# Patient Record
Sex: Male | Born: 2005 | Race: White | Hispanic: No | Marital: Single
Health system: Southern US, Academic
[De-identification: ages and names within clinical notes are randomized; demographics above are authoritative.]

## PROBLEM LIST (undated history)

## (undated) ENCOUNTER — Ambulatory Visit (HOSPITAL_COMMUNITY): Source: Home / Self Care

## (undated) ENCOUNTER — Encounter

## (undated) ENCOUNTER — Telehealth

## (undated) ENCOUNTER — Ambulatory Visit

## (undated) ENCOUNTER — Telehealth: Attending: Registered" | Primary: Registered"

## (undated) ENCOUNTER — Ambulatory Visit: Attending: Federally Qualified Health Center (FQHC) | Primary: Federally Qualified Health Center (FQHC)

## (undated) ENCOUNTER — Telehealth: Attending: Pediatric Gastroenterology | Primary: Pediatric Gastroenterology

## (undated) ENCOUNTER — Encounter: Attending: Pediatric Gastroenterology | Primary: Pediatric Gastroenterology

## (undated) ENCOUNTER — Encounter
Attending: Student in an Organized Health Care Education/Training Program | Primary: Student in an Organized Health Care Education/Training Program

## (undated) ENCOUNTER — Ambulatory Visit: Payer: BLUE CROSS/BLUE SHIELD | Attending: Registered" | Primary: Registered"

## (undated) ENCOUNTER — Ambulatory Visit: Payer: BLUE CROSS/BLUE SHIELD | Attending: Pediatric Gastroenterology | Primary: Pediatric Gastroenterology

## (undated) ENCOUNTER — Ambulatory Visit: Attending: Family | Primary: Family

## (undated) ENCOUNTER — Ambulatory Visit: Payer: BLUE CROSS/BLUE SHIELD

## (undated) DIAGNOSIS — I10 Essential (primary) hypertension: Secondary | ICD-10-CM

## (undated) DIAGNOSIS — A749 Chlamydial infection, unspecified: Secondary | ICD-10-CM

## (undated) DIAGNOSIS — J45909 Unspecified asthma, uncomplicated: Secondary | ICD-10-CM

## (undated) DIAGNOSIS — F419 Anxiety disorder, unspecified: Secondary | ICD-10-CM

## (undated) HISTORY — PX: MYRINGOTOMY WITH TUBE PLACEMENT: SHX5663

---
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
## 2023-01-10 DIAGNOSIS — K200 Eosinophilic esophagitis: Principal | ICD-10-CM

## 2023-01-10 MED ORDER — DUPIXENT 300 MG/2 ML SUBCUTANEOUS PEN INJECTOR
SUBCUTANEOUS | 11 refills | 28 days
Start: 2023-01-10 — End: 2024-02-09

## 2023-01-11 MED ORDER — DUPIXENT 300 MG/2 ML SUBCUTANEOUS PEN INJECTOR
SUBCUTANEOUS | 2 refills | 28 days | Status: CP
Start: 2023-01-11 — End: 2023-04-11
  Filled 2023-01-16: qty 8, 28d supply, fill #0

## 2023-01-12 NOTE — Unmapped (Signed)
[facility] Refill Coordination Note    Specialty Medication(s) to be Shipped:   Inflammatory Disorders: Dupixent    Other medication(s) to be shipped: No additional medications requested for fill at this time     Robert Allison, DOB: 2005-06-30  Phone: [phone] (home)       All above HIPAA information was verified with patient's family member, father.  *Informed Father that this was last refill and Patient would need an appointment before additional fills would be submitted per Provider's office.    Was a Nurse, learning disability used for this call? No    Completed refill call assessment today to schedule patient's medication shipment from the [facility] [phone]).  All relevant notes have been reviewed.     Specialty medication(s) and dose(s) confirmed: Regimen is correct and unchanged.   Changes to medications: [name] reports no changes at this time.  Changes to insurance: No  New side effects reported not previously addressed with a pharmacist or physician: None reported  Questions for the pharmacist: No    Confirmed patient received a Conservation officer, historic buildings and a Surveyor, mining with first shipment. The patient will receive a drug information handout for each medication shipped and additional FDA Medication Guides as required.       DISEASE/MEDICATION-SPECIFIC INFORMATION        For patients on injectable medications: Patient currently has 1 doses left.  Next injection is scheduled for 01/17/23.    SPECIALTY MEDICATION ADHERENCE     Medication Adherence    Patient reported X missed doses in the last month: 0  Specialty Medication: dupilumab (DUPIXENT PEN) 300 mg/2 mL PnIj  Patient is on additional specialty medications: No  Informant: father              Were doses missed due to medication being on hold? No    DUPIXENT PEN 300 mg/2 mL Pnij (dupilumab)  : 5 days of medicine on hand       REFERRAL TO PHARMACIST     Referral to the pharmacist: Not needed      [facility]     Shipping address confirmed in Epic.       Delivery Scheduled: Yes, Expected medication delivery date: 01/17/23.     Medication will be delivered via UPS to the prescription address in Epic WAM.    Robert Allison   [facility] Specialty Technician

## 2023-01-19 NOTE — Unmapped (Signed)
Robert Allison from [facility] contacted, asking to renewal of DME order.    The last visit was done by Dr.Borinsky in August 2023. We also [place] adult GI [place] as they requested last year.    Called back [facility] and informed them of the status, declining to sign the renewal order.    EJ

## 2023-02-09 ENCOUNTER — Telehealth: Payer: Self-pay | Admitting: Gastroenterology

## 2023-02-09 NOTE — Unmapped (Signed)
Patient was notified of operational disruptions. Patient opted to: schedule their refill with understanding of potential delay until 10/1 or later. This was facilitated by pharmacy staff     [facility] Delivery Pharmacy Refill Coordination Note    Specialty Medication(s) to be Shipped:   Inflammatory Disorders: Dupixent    Other medication(s) to be shipped: No additional medications requested for fill at this time     Robert Allison, DOB: 08-13-2005  Phone: [phone] (home)       All above HIPAA information was verified with patient's family member, father.     Was a Nurse, learning disability used for this call? No    Completed refill call assessment today to schedule patient's medication shipment from the [facility]  [phone]).  All relevant notes have been reviewed.     Specialty medication(s) and dose(s) confirmed: Regimen is correct and unchanged.   Changes to medications: Moritz reports no changes at this time.  Changes to insurance: No  New side effects reported not previously addressed with a pharmacist or physician: None reported  Questions for the pharmacist: No    Confirmed patient received a Conservation officer, historic buildings and a Surveyor, mining with first shipment. The patient will receive a drug information handout for each medication shipped and additional FDA Medication Guides as required.       DISEASE/MEDICATION-SPECIFIC INFORMATION        For patients on injectable medications: Patient currently has 2 doses left.  Next injection is scheduled for 10/02,10/09.    SPECIALTY MEDICATION ADHERENCE     Medication Adherence    Patient reported X missed doses in the last month: 0  Specialty Medication: dupixent  Patient is on additional specialty medications: No  Patient is on more than two specialty medications: No  Any gaps in refill history greater than 2 weeks in the last 3 months: no  Demonstrates understanding of importance of adherence: yes  Informant: father  Reliability of informant: reliable  Provider-estimated medication adherence level: good  Patient is at risk for Non-Adherence: No  Reasons for non-adherence: no problems identified  Confirmed plan for next specialty medication refill: delivery by pharmacy  Refills needed for supportive medications: not needed          Refill Coordination    Has the Patients' Contact Information Changed: No  Is the Shipping Address Different: No         Were doses missed due to medication being on hold? No    Dupxient  300/2 mg/ml: 14 days of medicine on hand       REFERRAL TO PHARMACIST     Referral to the pharmacist: Not needed      [facility]     Shipping address confirmed in Epic.       Delivery Scheduled: Yes, Expected medication delivery date: 10/03.     Medication will be delivered via UPS to the prescription address in Epic WAM.    [name]  Specialty Technician

## 2023-02-09 NOTE — Telephone Encounter (Signed)
Good morning Dr. 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 Patient was last seen in 12/2021 with [facility] in the Pediatric GI. He has since turned 16  and would like to establish GI care closer to home and also an adult clinic. Records were obtained and scanned into Media for your review. Would you please advise on scheduling?  Thank you.

## 2023-02-14 DIAGNOSIS — K200 Eosinophilic esophagitis: Principal | ICD-10-CM

## 2023-02-15 MED FILL — DUPIXENT 300 MG/2 ML SUBCUTANEOUS PEN INJECTOR: SUBCUTANEOUS | 28 days supply | Qty: 8 | Fill #1

## 2023-02-20 NOTE — Unmapped (Signed)
Tried to arrange scheduling a follow-up visit with Dr.Mills at [facility] in [place] because of their preference.      Gave contact number for Hazle Coca at [facility] for further step.  Dad agreed to call and schedule a visit with Dr.Mills there.    EJ

## 2023-02-21 ENCOUNTER — Telehealth (INDEPENDENT_AMBULATORY_CARE_PROVIDER_SITE_OTHER): Payer: Self-pay | Admitting: Pediatric Gastroenterology

## 2023-02-21 NOTE — Telephone Encounter (Signed)
  Name of who is calling: Oneal Grout Relationship to Patient: Dad   Best contact number: [phone]  Provider they see: Dr.Sylvester  Reason for call: Dad is calling and requesting to speak with Cori.      PRESCRIPTION REFILL ONLY  Name of prescription:  Pharmacy:

## 2023-02-21 NOTE — Telephone Encounter (Signed)
Patient/Dad was told to call her by [facility] but we do not have DPR to speak to dad. This was relayed to [facility] that Vadim will have to fill out a DPR for [place] to speak to dad. [facility] nurse relayed to myself that dad is going to bring Jacobin to fill this form out.

## 2023-02-22 NOTE — Telephone Encounter (Signed)
Dean Hale called the office. Dean Hale present. Explained to Dean Hale that Dr. Arvilla Market and Dr. Jacqlyn Krauss are both booked out until [facility] through December. Let Dean Hale know we cannot send in medications until Dean Hale has been seen. Dean Hale is concerned that Dean Hale is going to miss doses of his medication.  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 Dean Hale stated that Dean Hale has been fine on his Dupixent, and is worried that he will not be controlled if he misses doses.  Email sent to [facility] to relay this information and to let them know that I contacted [facility] and they can get a new patient in around mid-December.  Let Dean Hale know that someone will follow up with him once details are ironed out.

## 2023-02-23 DIAGNOSIS — K200 Eosinophilic esophagitis: Principal | ICD-10-CM

## 2023-02-23 NOTE — Unmapped (Signed)
Cori at [facility] clinic sent [place] a message:   1) Dad is concerned about missed doses of Dupixent if Robert Allison is not able to be seen by our provider by Dec thus the prescription is expired.   2) [facility] takes a new patient and a visit can be scheduled in around mid-December if the referral was placed soon ([address], [place], [place] [address]. Call: [phone], Fax: [phone], Hours Monday-Friday 8am-5pm).    Called dad, and no answer.  Left a message as following:    Dupixent was prescribed through Dec now.  If they cannot make a visit by Dec at [facility], then dad needs to call [facility] for making a visit with Dr.Mills.  If they can make a visit with Robert Allison near soon, then Robert Allison does not need to be seen by Dr.Mills.    Left a call back number for any questions or issues.    Faxed the referral to Robert Allison, as Dr.Mills is ok with referral to [facility].     EJ

## 2023-02-23 NOTE — Telephone Encounter (Signed)
Received email from [facility] nurse with the following plan.

## 2023-03-08 NOTE — Unmapped (Signed)
Patient was notified of operational disruptions. Patient opted to: schedule their refill with understanding of potential delay until 10/1 or later. This was facilitated by pharmacy staff     [facility] and Home Delivery Pharmacy Refill Coordination Note    Specialty Medication(s) to be Shipped:   Inflammatory Disorders: Dupixent    Other medication(s) to be shipped: No additional medications requested for fill at this time     Robert Allison, DOB: [birth year]  Phone: [phone] (home)       All above HIPAA information was verified with patient's family member, father.     Was a Nurse, learning disability used for this call? No    Completed refill call assessment today to schedule patient's medication shipment from the [facility] and Home Delivery Pharmacy  [phone]).  All relevant notes have been reviewed.     Specialty medication(s) and dose(s) confirmed: Regimen is correct and unchanged.   Changes to medications: Robert Allison reports no changes at this time.  Changes to insurance: No  New side effects reported not previously addressed with a pharmacist or physician: None reported  Questions for the pharmacist: No    Confirmed patient received a Conservation officer, historic buildings and a Surveyor, mining with first shipment. The patient will receive a drug information handout for each medication shipped and additional FDA Medication Guides as required.       DISEASE/MEDICATION-SPECIFIC INFORMATION        For patients on injectable medications: Patient currently has 1 doses left.  Next injection is scheduled for 10/30.    SPECIALTY MEDICATION ADHERENCE     Medication Adherence    Patient reported X missed doses in the last month: 0  Specialty Medication: dupilumab (DUPIXENT PEN) 300 mg/2 mL PnIj  Patient is on additional specialty medications: No  Informant: father                Were doses missed due to medication being on hold? No    Dupxient  300/2 mg/ml: 6 days of medicine on hand       REFERRAL TO PHARMACIST     Referral to the pharmacist: Not needed      [facility]     Shipping address confirmed in Epic.       Delivery Scheduled: Yes, Expected medication delivery date: 10/30.     Medication will be delivered via UPS to the prescription address in Epic WAM.    [facility]

## 2023-03-09 DIAGNOSIS — K200 Eosinophilic esophagitis: Principal | ICD-10-CM

## 2023-03-09 NOTE — Unmapped (Signed)
[name], Robert Allison's dad, called and informed that [name] does not accept new patients and ok with referral Rik to [facility] adult GI team because they cannot find any provider locally.      Referred him to [facility] adult GI.    Because of overdue for a follow-up visit, dad agreed to have a visit with  Dr.Mills for now while waiting for a visit with [facility] adult GI.    He agreed.    EJ

## 2023-03-09 NOTE — Telephone Encounter (Signed)
Following the email I received from nurse at [facility] stating Dean Hale is not accepting new patients, I was curious as to why [facility] told dad that they were not accepting new patients, so I gave them a call. I spoke to a referral coordinator, Dean Hale, who stated that Dean Hale's referral seemed like a transfer of care from adult GI to adult GI, so they stated that they have to have a provider review and decide if they will see the patient. The referral coordinator told me that Dean Hale's dad had come by their office yesterday and she explained this to him, but at the time, the reason for Dean Hale being referred was not clearly understood. I explained that he was seeing pediatric GI and is looking for adult GI, plus needs something closer as they live in [place] and driving to [facility] is a long distance. The referral coordinator made a note that it is a transfer from pediatric GI to adult GI and also the need to have a GI provider locally. The doctor still has to look over the referral and they will contact dad if he gives the go ahead to schedule.

## 2023-03-09 NOTE — Telephone Encounter (Signed)
Email from RN at [facility] - Mr.Blaney, Nagee's dad, called me and informed that he was told that [name] does not receive new patients.  He stated that we should have checked first.  So, I explained that you already checked it and then we referred him to them.    Anyway, he wants to refer Sevon to [facility] adult GI team because they cannot find any provider locally.  So, I am going to refer to adult GI. However, it is not going to be near soon because [facility] adult GI usually has a long waiting line.    So, Adley needs a visit with Dr.Mills for now while he is waiting for a visit with [facility] adult GI.  Dad agreed with the plan.   Staff message sent to front office to schedule.

## 2023-03-13 MED FILL — DUPIXENT 300 MG/2 ML SUBCUTANEOUS PEN INJECTOR: SUBCUTANEOUS | 28 days supply | Qty: 8 | Fill #2

## 2023-03-22 ENCOUNTER — Telehealth (INDEPENDENT_AMBULATORY_CARE_PROVIDER_SITE_OTHER): Payer: Self-pay | Admitting: Pediatrics

## 2023-03-22 NOTE — Telephone Encounter (Signed)
Dad called and is requesting to speak with Dr.Mills. He's requesting a callback at [phone].

## 2023-03-29 DIAGNOSIS — K200 Eosinophilic esophagitis: Principal | ICD-10-CM

## 2023-03-29 MED ORDER — DUPIXENT 300 MG/2 ML SUBCUTANEOUS PEN INJECTOR
SUBCUTANEOUS | 2 refills | 28 days
Start: 2023-03-29 — End: 2023-06-27

## 2023-03-29 NOTE — Unmapped (Signed)
Patient needs a follow up or to see adult GI

## 2023-04-10 ENCOUNTER — Other Ambulatory Visit: Payer: Self-pay

## 2023-04-10 ENCOUNTER — Encounter (HOSPITAL_COMMUNITY): Payer: Self-pay

## 2023-04-10 ENCOUNTER — Emergency Department (HOSPITAL_COMMUNITY)
Admission: EM | Admit: 2023-04-10 | Discharge: 2023-04-10 | Disposition: A | Discharge status: Home / Self Care | Payer: Medicaid Other | Attending: Emergency Medicine | Admitting: Emergency Medicine

## 2023-04-10 DIAGNOSIS — J029 Acute pharyngitis, unspecified: Secondary | ICD-10-CM | POA: Insufficient documentation

## 2023-04-10 DIAGNOSIS — J45909 Unspecified asthma, uncomplicated: Secondary | ICD-10-CM | POA: Insufficient documentation

## 2023-04-10 DIAGNOSIS — R059 Cough, unspecified: Secondary | ICD-10-CM | POA: Diagnosis present

## 2023-04-10 DIAGNOSIS — Z20822 Contact with and (suspected) exposure to covid-19: Secondary | ICD-10-CM | POA: Diagnosis not present

## 2023-04-10 DIAGNOSIS — Z91010 Allergy to peanuts: Secondary | ICD-10-CM | POA: Diagnosis not present

## 2023-04-10 LAB — GROUP A STREP BY PCR: Group A Strep by PCR: NOT DETECTED

## 2023-04-10 LAB — RESP PANEL BY RT-PCR (RSV, FLU A&B, COVID)  RVPGX2
Influenza A by PCR: NEGATIVE
Influenza B by PCR: NEGATIVE
Resp Syncytial Virus by PCR: NEGATIVE
SARS Coronavirus 2 by RT PCR: NEGATIVE

## 2023-04-10 MED ORDER — PREDNISONE 20 MG PO TABS
40.0000 mg | ORAL_TABLET | Freq: Once | ORAL | Status: AC
  Administered 2023-04-10: 40 mg via ORAL
  Filled 2023-04-10: qty 2

## 2023-04-10 MED ORDER — PREDNISONE 20 MG PO TABS: 20.0000 mg | ORAL_TABLET | Freq: Every day | ORAL | 0 refills | Status: AC

## 2023-04-10 NOTE — ED Triage Notes (Signed)
Pt arrived with EMS from home. 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 Takes injection once a week and has not missed a dose, takes it every Wednesday. Reports coughing, sore throat and hasn't been able to eat over the last day due to the pain. States feeling like he is having difficulty breathing. Able to speak in full sentences

## 2023-04-10 NOTE — ED Provider Notes (Signed)
[facility] EMERGENCY DE[facility]MENT AT [facility] Provider Note   CSN: [id number] Arrival date & time: 04/10/23  1608     History  Chief Complaint  Patient presents with   Sore Throat   Cough    Dean Hale is a 17 y.o. male.   Sore Throat  Cough Patient presents with sore throat and cough.  Has had for the last few days.  States contact with a person with RSV.  States that he feels if there is tightness in his throat and upper chest.  History of eosinophilic esophagitis and asthma.  States this does not feel like his esophagus.  No fevers.  Does have pain with swallowing.     Home Medications Prior to Admission medications   Medication Sig Start Date End Date Taking? Authorizing Provider  albuterol (PROVENTIL HFA;VENTOLIN HFA) 108 (90 Base) MCG/ACT inhaler Inhale 1-2 puffs into the lungs every 6 (six) hours as needed for wheezing or shortness of breath. 01/29/18   Reichert, Wyvonnia Dusky, MD  cetirizine (ZYRTEC) 10 MG tablet Take 10 mg by mouth daily.    [provider]  clindamycin-benzoyl peroxide (BENZACLIN) gel Apply 1 application topically daily. 09/05/20   [provider]  cyproheptadine (PERIACTIN) 4 MG tablet Take 4 mg by mouth in the morning, at noon, and at bedtime. 09/10/20   [provider]  desvenlafaxine (PRISTIQ) 50 MG 24 hr tablet Take 50 mg by mouth daily. 08/15/20   [provider]  EPINEPHrine 0.3 mg/0.3 mL IJ SOAJ injection Inject 0.3 mg into the muscle as needed for anaphylaxis. 09/16/20   [provider]  FLOVENT HFA 220 MCG/ACT inhaler Inhale 4 puffs into the lungs in the morning and at bedtime. 09/05/20   [provider]  montelukast (SINGULAIR) 10 MG tablet Take 1 tablet (10 mg total) by mouth at bedtime. 02/05/22   Niel Hummer, MD  Multiple Vitamin (MULTIVITAMIN) tablet Take 1 tablet by mouth daily.    [provider]  pantoprazole (PROTONIX) 40 MG tablet Take 1 tablet (40 mg total) by mouth 2  (two) times daily. 01/12/22 02/11/22  Niel Hummer, MD      Allergies    Peanut-containing drug products, Almond (diagnostic), Cashew nut oil, Shellfish allergy, Fish allergy, and Insect extract    Review of Systems   Review of Systems  Respiratory:  Positive for cough.     Physical Exam Updated Vital Signs BP 130/73   Pulse 71   Temp 98.5 F (36.9 C) (Oral)   Ht 5\' 4"  (1.626 m)   Wt 68 kg   SpO2 97%   BMI 25.75 kg/m  Physical Exam Vitals and nursing note reviewed.  HENT:     Head: Atraumatic.     Mouth/Throat:     Pharynx: Pharyngeal swelling and posterior oropharyngeal erythema present. No oropharyngeal exudate.  Cardiovascular:     Rate and Rhythm: Normal rate.  Pulmonary:     Breath sounds: No wheezing, rhonchi or rales.  Abdominal:     Tenderness: There is no abdominal tenderness.  Neurological:     Mental Status: He is alert.     ED Results / Procedures / Treatments   Labs (all labs ordered are listed, but only abnormal results are displayed) Labs Reviewed  RESP PANEL BY RT-PCR (RSV, FLU A&B, COVID)  RVPGX2  GROUP A STREP BY PCR    EKG None  Radiology No results found.  Procedures Procedures    Medications Ordered in ED Medications - No  data to display  ED Course/ Medical Decision Making/ A&P                                 Medical Decision Making Risk Prescription drug management.   Patient with cough sore throat.  Lungs clear.  Did have RSV contact.  History of asthma and eosinophilic esophagitis.  Reviewed previous pediatric GI note.  Differential diagnosis does include causes such as asthma, eosinophilic esophagitis, pneumonia.  Lungs clear doubt pneumonia.  Will get strep test due to erythema on throat.  Decreased oral intake.  However vitals reassuring do not think we need blood work.  Also will check flu COVID and RSV. Strep flu COVID and RSV negative.  Will treat as if it is asthma pharyngitis and potentially eosinophilic  esophagitis which would be steroids.  Well-appearing and will discharge home.         Final Clinical Impression(s) / ED Diagnoses Final diagnoses:  None    Rx / DC Orders ED Discharge Orders     None         Benjiman Core, MD 04/10/23 2048

## 2023-04-10 NOTE — Discharge Instructions (Signed)
This could have a component of your asthma or potentially your eosinophilic esophagitis.  Your testing was negative for specific infections.  The steroids should help.  Try and keep yourself hydrated.  Follow-up with your doctor as needed.

## 2023-05-03 DIAGNOSIS — K200 Eosinophilic esophagitis: Principal | ICD-10-CM

## 2023-05-03 MED ORDER — DUPIXENT 300 MG/2 ML SUBCUTANEOUS PEN INJECTOR
SUBCUTANEOUS | 11 refills | 28.00 days
Start: 2023-05-03 — End: 2023-08-01

## 2023-05-03 NOTE — Unmapped (Signed)
[facility] Special[facility] and Home Delivery Pharmacy Refill Coordination Note    Specialty Medication(s) to be Shipped:   Inflammatory Disorders: Dupixent    Other medication(s) to be shipped: No additional medications requested for fill at this time     Robert Allison, DOB: 05/30/2005  Phone: There are no phone numbers on file.      All above HIPAA information was verified with patient's family member, father.     Was a Nurse, learning disability used for this call? No    Completed refill call assessment today to schedule patient's medication shipment from the [facility] and Home Delivery Pharmacy  [phone]).  All relevant notes have been reviewed.     Specialty medication(s) and dose(s) confirmed: Regimen is correct and unchanged.   Changes to medications: Bossie reports no changes at this time.  Changes to insurance: No  New side effects reported not previously addressed with a pharmacist or physician: None reported  Questions for the pharmacist: No    Confirmed patient received a Conservation officer, historic buildings and a Surveyor, mining with first shipment. The patient will receive a drug information handout for each medication shipped and additional FDA Medication Guides as required.       DISEASE/MEDICATION-SPECIFIC INFORMATION        For patients on injectable medications: Patient currently has 0 doses left.  Next injection is scheduled for 05/09/2023.    SPECIALTY MEDICATION ADHERENCE     Medication Adherence    Patient reported X missed doses in the last month: 0  Specialty Medication: dupilumab: DUPIXENT PEN 300 mg/2 mL Pnij  Patient is on additional specialty medications: No  Patient is on more than two specialty medications: No  Any gaps in refill history greater than 2 weeks in the last 3 months: no  Demonstrates understanding of importance of adherence: yes  Informant: father  Reliability of informant: reliable  Provider-estimated medication adherence level: good  Patient is at risk for Non-Adherence: No  Reasons for non-adherence: no problems identified  Confirmed plan for next specialty medication refill: delivery by pharmacy  Refills needed for supportive medications: yes, ordered or provider notified          Refill Coordination    Has the Patients' Contact Information Changed: No  Is the Shipping Address Different: No         Were doses missed due to medication being on hold? No    Dupixent 300mg /44mL : 0 doses of medicine on hand       REFERRAL TO PHARMACIST     Referral to the pharmacist: Not needed      [facility]     Shipping address confirmed in Epic.       Delivery Scheduled: Yes, Expected medication delivery date: 05/08/2023.     Medication will be delivered via UPS to the prescription address in Epic WAM.    Elnora Morrison, PharmD   [facility] Specialty and Home Delivery Pharmacy  Specialty Pharmacist

## 2023-05-07 MED FILL — DUPIXENT 300 MG/2 ML SUBCUTANEOUS PEN INJECTOR: SUBCUTANEOUS | 28 days supply | Qty: 8 | Fill #0

## 2023-06-09 NOTE — Unmapped (Signed)
[facility] Specialty and Home Delivery Pharmacy Refill Coordination Note    Specialty Medication(s) to be Shipped:   Inflammatory Disorders: Dupixent    Other medication(s) to be shipped: No additional medications requested for fill at this time     Robert Allison, DOB: 2006-03-12  Phone: There are no phone numbers on file.      All above HIPAA information was verified with patient's family member, dad.     Was a Nurse, learning disability used for this call? No    Completed refill call assessment today to schedule patient's medication shipment from the [facility] and Home Delivery Pharmacy  [phone]).  All relevant notes have been reviewed.     Specialty medication(s) and dose(s) confirmed: Regimen is correct and unchanged.   Changes to medications: Robert Allison reports starting the following medications: cymbalta  Changes to insurance: No  New side effects reported not previously addressed with a pharmacist or physician: None reported  Questions for the pharmacist: No    Confirmed patient received a Conservation officer, historic buildings and a Surveyor, mining with first shipment. The patient will receive a drug information handout for each medication shipped and additional FDA Medication Guides as required.       DISEASE/MEDICATION-SPECIFIC INFORMATION        For patients on injectable medications: Patient currently has 1 doses left.  Next injection is scheduled for 01/28.    SPECIALTY MEDICATION ADHERENCE     Medication Adherence    Patient reported X missed doses in the last month: 0  Specialty Medication: dupixent  Patient is on additional specialty medications: No  Patient is on more than two specialty medications: No  Any gaps in refill history greater than 2 weeks in the last 3 months: no  Demonstrates understanding of importance of adherence: yes  Informant: father  Reliability of informant: reliable  Provider-estimated medication adherence level: good  Patient is at risk for Non-Adherence: No  Reasons for non-adherence: no problems identified  Confirmed plan for next specialty medication refill: delivery by pharmacy  Refills needed for supportive medications: not needed          Refill Coordination    Has the Patients' Contact Information Changed: No  Is the Shipping Address Different: No         Were doses missed due to medication being on hold? No    Dupixent  300/2 mg/ml: 14 days of medicine on hand       REFERRAL TO PHARMACIST     Referral to the pharmacist: Not needed      [facility]     Shipping address confirmed in Epic.       Delivery Scheduled: Yes, Expected medication delivery date: 01/28.     Medication will be delivered via UPS to the prescription address in Epic WAM.    [name]  Specialty Technician

## 2023-06-12 MED FILL — DUPIXENT 300 MG/2 ML SUBCUTANEOUS PEN INJECTOR: SUBCUTANEOUS | 28 days supply | Qty: 8 | Fill #1

## 2023-07-04 ENCOUNTER — Emergency Department (HOSPITAL_COMMUNITY): Payer: Medicaid Other

## 2023-07-04 ENCOUNTER — Encounter (HOSPITAL_COMMUNITY): Payer: Self-pay

## 2023-07-04 ENCOUNTER — Emergency Department (HOSPITAL_COMMUNITY)
Admission: EM | Admit: 2023-07-04 | Discharge: 2023-07-04 | Disposition: A | Discharge status: Home / Self Care | Payer: Medicaid Other | Attending: Emergency Medicine | Admitting: Emergency Medicine

## 2023-07-04 ENCOUNTER — Other Ambulatory Visit: Payer: Self-pay

## 2023-07-04 DIAGNOSIS — Z7951 Long term (current) use of inhaled steroids: Secondary | ICD-10-CM | POA: Insufficient documentation

## 2023-07-04 DIAGNOSIS — R0789 Other chest pain: Secondary | ICD-10-CM

## 2023-07-04 DIAGNOSIS — R072 Precordial pain: Secondary | ICD-10-CM | POA: Diagnosis not present

## 2023-07-04 DIAGNOSIS — Z79899 Other long term (current) drug therapy: Secondary | ICD-10-CM | POA: Diagnosis not present

## 2023-07-04 DIAGNOSIS — I10 Essential (primary) hypertension: Secondary | ICD-10-CM | POA: Insufficient documentation

## 2023-07-04 DIAGNOSIS — R202 Paresthesia of skin: Secondary | ICD-10-CM | POA: Insufficient documentation

## 2023-07-04 DIAGNOSIS — J45909 Unspecified asthma, uncomplicated: Secondary | ICD-10-CM | POA: Insufficient documentation

## 2023-07-04 DIAGNOSIS — R079 Chest pain, unspecified: Secondary | ICD-10-CM | POA: Diagnosis present

## 2023-07-04 LAB — TROPONIN I (HIGH SENSITIVITY)
Troponin I (High Sensitivity): 2 ng/L (ref <18)
Troponin I (High Sensitivity): <2 ng/L (ref <18)

## 2023-07-04 LAB — CBC
HCT: 46.6 % (ref 39.0–52.0)
Hemoglobin: 16.2 g/dL (ref 13.0–17.0)
MCH: 29.7 pg (ref 26.0–34.0)
MCHC: 34.8 g/dL (ref 30.0–36.0)
MCV: 85.3 fL (ref 80.0–100.0)
Platelets: 269 10*3/uL (ref 150–400)
RBC: 5.46 MIL/uL (ref 4.22–5.81)
RDW: 11.9 % (ref 11.5–15.5)
WBC: 7.2 10*3/uL (ref 4.0–10.5)
nRBC: 0 % (ref 0.0–0.2)

## 2023-07-04 LAB — BASIC METABOLIC PANEL
Anion gap: 13 (ref 5–15)
BUN: 12 mg/dL (ref 6–20)
CO2: 22 mmol/L (ref 22–32)
Calcium: 9.6 mg/dL (ref 8.9–10.3)
Chloride: 104 mmol/L (ref 98–111)
Creatinine, Ser: 1.03 mg/dL (ref 0.61–1.24)
GFR, Estimated: >60 mL/min (ref >60)
Glucose, Bld: 108 mg/dL — ABNORMAL HIGH (ref 70–99)
Potassium: 4.1 mmol/L (ref 3.5–5.1)
Sodium: 139 mmol/L (ref 135–145)

## 2023-07-04 MED ORDER — ALUM & MAG HYDROXIDE-SIMETH 200-200-20 MG/5ML PO SUSP
30.0000 mL | Freq: Once | ORAL | Status: AC
  Administered 2023-07-04: 30 mL via ORAL
  Filled 2023-07-04: qty 30

## 2023-07-04 MED ORDER — LIDOCAINE VISCOUS HCL 2 % MT SOLN
15.0000 mL | Freq: Once | OROMUCOSAL | Status: AC
  Administered 2023-07-04: 15 mL via ORAL
  Filled 2023-07-04: qty 15

## 2023-07-04 MED ORDER — SUCRALFATE 1 G PO TABS
1.0000 g | ORAL_TABLET | Freq: Once | ORAL | Status: AC
  Administered 2023-07-04: 1 g via ORAL
  Filled 2023-07-04: qty 1

## 2023-07-04 MED ORDER — METOCLOPRAMIDE HCL 5 MG/ML IJ SOLN
10.0000 mg | Freq: Once | INTRAMUSCULAR | Status: AC
  Administered 2023-07-04: 10 mg via INTRAVENOUS
  Filled 2023-07-04: qty 2

## 2023-07-04 MED ORDER — LORAZEPAM 2 MG/ML IJ SOLN: 1.0000 mg | Freq: Once | INTRAMUSCULAR | Status: DC

## 2023-07-04 MED ORDER — PANTOPRAZOLE SODIUM 40 MG IV SOLR
40.0000 mg | Freq: Once | INTRAVENOUS | Status: AC
  Administered 2023-07-04: 40 mg via INTRAVENOUS
  Filled 2023-07-04: qty 10

## 2023-07-04 NOTE — ED Notes (Signed)
Pt refuses MRI and Ativan medication for relief of claustrophobia. Pt reports improvement in pain score following meds

## 2023-07-04 NOTE — ED Notes (Signed)
 Patient transported to MRI

## 2023-07-04 NOTE — ED Provider Notes (Signed)
[place] EMERGENCY DEPARTMENT AT [facility] Provider Note   CSN: [id number] Arrival date & time: 07/04/23  0053     History  Chief Complaint  Patient presents with   Chest Pain    Dean Hale is a 18 y.o. male.  The history is provided by the patient.  Chest Pain Dean Hale is a 18 y.o. male who presents to the Emergency Department complaining of chest pain and numbness.  He presents to the emergency department by EMS for evaluation of left-sided chest pain that started 3 days ago.  Pain is central and left-sided and described as both sharp and dull.  Pain is constant.  It is worse with standing.  About 10 PM he developed left-sided numbness involving the left arm initially but now it is involving the left face, arm, leg.  He describes it as a tingling/pins and needle sensation.  No reported injuries.  He also has intermittent headache for the last 3 days.  He is planning to get his wisdom teeth pulled next week and has been prescribed antibiotics but has not started them yet.  He has a history of eosinophilic esophagitis, asthma, hypertension.  No fever.  He does feel short of breath.  No hemoptysis.  He did have dark emesis once, unsure if it was blood.  No hematochezia or melena.  Takes hydrochlorothiazide, dupixant weekly, protonix, duloxetine, symbicort, cetirizine.     Home Medications Prior to Admission medications   Medication Sig Start Date End Date Taking? Authorizing Provider  albuterol (PROVENTIL HFA;VENTOLIN HFA) 108 (90 Base) MCG/ACT inhaler Inhale 1-2 puffs into the lungs every 6 (six) hours as needed for wheezing or shortness of breath. 01/29/18   Reichert, Wyvonnia Dusky, MD  cetirizine (ZYRTEC) 10 MG tablet Take 10 mg by mouth daily.    [provider]  clindamycin-benzoyl peroxide (BENZACLIN) gel Apply 1 application topically daily. 09/05/20   [provider]  cyproheptadine (PERIACTIN) 4 MG tablet Take 4 mg by mouth in the morning, at  noon, and at bedtime. 09/10/20   [provider]  desvenlafaxine (PRISTIQ) 50 MG 24 hr tablet Take 50 mg by mouth daily. 08/15/20   [provider]  EPINEPHrine 0.3 mg/0.3 mL IJ SOAJ injection Inject 0.3 mg into the muscle as needed for anaphylaxis. 09/16/20   [provider]  FLOVENT HFA 220 MCG/ACT inhaler Inhale 4 puffs into the lungs in the morning and at bedtime. 09/05/20   [provider]  montelukast (SINGULAIR) 10 MG tablet Take 1 tablet (10 mg total) by mouth at bedtime. 02/05/22   Niel Hummer, MD  Multiple Vitamin (MULTIVITAMIN) tablet Take 1 tablet by mouth daily.    [provider]  pantoprazole (PROTONIX) 40 MG tablet Take 1 tablet (40 mg total) by mouth 2 (two) times daily. 01/12/22 02/11/22  Niel Hummer, MD      Allergies    Peanut-containing drug products, Almond (diagnostic), Cashew nut oil, Shellfish allergy, Fish allergy, and Insect extract    Review of Systems   Review of Systems  Cardiovascular:  Positive for chest pain.  All other systems reviewed and are negative.   Physical Exam Updated Vital Signs BP (!) 146/97   Pulse 76   Temp 98.1 F (36.7 C)   Resp 16   SpO2 99%  Physical Exam Vitals and nursing note reviewed.  Constitutional:      Appearance: He is well-developed.  HENT:     Head: Normocephalic and atraumatic.  Cardiovascular:  Rate and Rhythm: Normal rate and regular rhythm.     Heart sounds: No murmur heard. Pulmonary:     Effort: Pulmonary effort is normal. No respiratory distress.     Breath sounds: Normal breath sounds.  Abdominal:     Palpations: Abdomen is soft.     Tenderness: There is no abdominal tenderness. There is no guarding or rebound.  Musculoskeletal:        General: No tenderness.     Comments: 2+ radial and DP pulses bilaterally  Skin:    General: Skin is warm and dry.  Neurological:     Mental Status: He is alert and oriented to person, place, and time.     Comments: Visual  fields grossly intact.  No asymmetry of facial movements.  There is altered sensation to light touch in the left upper extremity, left lower extremity.  There is mild weakness in the left upper extremity, left lower extremity.  The strength testing is inconsistent in the extremities.  Psychiatric:        Behavior: Behavior normal.     ED Results / Procedures / Treatments   Labs (all labs ordered are listed, but only abnormal results are displayed) Labs Reviewed  BASIC METABOLIC PANEL - Abnormal; Notable for the following components:      Result Value   Glucose, Bld 108 (*)    All other components within normal limits  CBC  TROPONIN I (HIGH SENSITIVITY)  TROPONIN I (HIGH SENSITIVITY)    EKG None  Radiology CT Head Wo Contrast Result Date: 07/04/2023 CLINICAL DATA:  Neuro deficit, acute, stroke suspected; Cervical radiculopathy, no red flags EXAM: CT HEAD WITHOUT CONTRAST CT CERVICAL SPINE WITHOUT CONTRAST TECHNIQUE: Multidetector CT imaging of the head and cervical spine was performed following the standard protocol without intravenous contrast. Multiplanar CT image reconstructions of the cervical spine were also generated. RADIATION DOSE REDUCTION: This exam was performed according to the departmental dose-optimization program which includes automated exposure control, adjustment of the mA and/or kV according to patient size and/or use of iterative reconstruction technique. COMPARISON:  CT head 02/04/2022 FINDINGS: CT HEAD FINDINGS Brain: No evidence of large-territorial acute infarction. No parenchymal hemorrhage. No mass lesion. No extra-axial collection. No mass effect or midline shift. No hydrocephalus. Basilar cisterns are patent. Vascular: No hyperdense vessel. Skull: No acute fracture or focal lesion. Sinuses/Orbits: Paranasal sinuses and mastoid air cells are clear. The orbits are unremarkable. Other: None. CT CERVICAL SPINE FINDINGS Alignment: Normal. Skull base and vertebrae: No  acute fracture. No aggressive appearing focal osseous lesion or focal pathologic process. Soft tissues and spinal canal: No prevertebral fluid or swelling. No visible canal hematoma. Upper chest: Unremarkable. Other: None. IMPRESSION: 1. No acute intracranial abnormality. 2. No acute displaced fracture or traumatic listhesis of the cervical spine. Electronically Signed   By: Tish Frederickson M.D.   On: 07/04/2023 02:19   CT Cervical Spine Wo Contrast Result Date: 07/04/2023 CLINICAL DATA:  Neuro deficit, acute, stroke suspected; Cervical radiculopathy, no red flags EXAM: CT HEAD WITHOUT CONTRAST CT CERVICAL SPINE WITHOUT CONTRAST TECHNIQUE: Multidetector CT imaging of the head and cervical spine was performed following the standard protocol without intravenous contrast. Multiplanar CT image reconstructions of the cervical spine were also generated. RADIATION DOSE REDUCTION: This exam was performed according to the departmental dose-optimization program which includes automated exposure control, adjustment of the mA and/or kV according to patient size and/or use of iterative reconstruction technique. COMPARISON:  CT head 02/04/2022 FINDINGS: CT HEAD FINDINGS Brain:  No evidence of large-territorial acute infarction. No parenchymal hemorrhage. No mass lesion. No extra-axial collection. No mass effect or midline shift. No hydrocephalus. Basilar cisterns are patent. Vascular: No hyperdense vessel. Skull: No acute fracture or focal lesion. Sinuses/Orbits: Paranasal sinuses and mastoid air cells are clear. The orbits are unremarkable. Other: None. CT CERVICAL SPINE FINDINGS Alignment: Normal. Skull base and vertebrae: No acute fracture. No aggressive appearing focal osseous lesion or focal pathologic process. Soft tissues and spinal canal: No prevertebral fluid or swelling. No visible canal hematoma. Upper chest: Unremarkable. Other: None. IMPRESSION: 1. No acute intracranial abnormality. 2. No acute displaced fracture  or traumatic listhesis of the cervical spine. Electronically Signed   By: Tish Frederickson M.D.   On: 07/04/2023 02:19   DG Chest 2 View Result Date: 07/04/2023 CLINICAL DATA:  Chest pain EXAM: CHEST - 2 VIEW COMPARISON:  Chest x-ray 02/04/2022 FINDINGS: The heart and mediastinal contours are within normal limits. No focal consolidation. No pulmonary edema. No pleural effusion. No pneumothorax. No acute osseous abnormality. IMPRESSION: No active cardiopulmonary disease. Electronically Signed   By: Tish Frederickson M.D.   On: 07/04/2023 01:17    Procedures Procedures    Medications Ordered in ED Medications  LORazepam (ATIVAN) injection 1 mg (has no administration in time range)  sucralfate (CARAFATE) tablet 1 g (has no administration in time range)  alum & mag hydroxide-simeth (MAALOX/MYLANTA) 200-200-20 MG/5ML suspension 30 mL (30 mLs Oral Given 07/04/23 0313)    And  lidocaine (XYLOCAINE) 2 % viscous mouth solution 15 mL (15 mLs Oral Given 07/04/23 0313)  metoCLOPramide (REGLAN) injection 10 mg (10 mg Intravenous Given 07/04/23 0314)  pantoprazole (PROTONIX) injection 40 mg (40 mg Intravenous Given 07/04/23 2956)    ED Course/ Medical Decision Making/ A&P                                 Medical Decision Making Amount and/or Complexity of Data Reviewed Labs: ordered. Radiology: ordered.  Risk Prescription drug management.   Pt with hx/o EOE, asthma here with CP, paresthesias (tingling) to LUE/LLE, L face.  He has inconsistent weakness on examination.  CT head and C-spine are negative for acute abnormality.  EKG is without acute ischemic changes and troponins are negative x 2.  Symptoms are partially improved after medications for headache, treated for reflux with Protonix for his chest pain.  He does have some ongoing paresthesias as well as chest discomfort on recheck.  Recommend MRI with and without contrast to evaluate for multiple sclerosis given family history.  Patient refuses MRI.   Offered sedation to make the process more comfortable and he continues to refuse.  Discussed risk of missed diagnosis.  Current clinical picture is not consistent with CVA, ACS, dissection, PE.  Plan to discharge patient with ongoing symptomatic treatment for his chest pain with PCP follow-up regarding his chest pain and paresthesias.  Return precautions discussed.        Final Clinical Impression(s) / ED Diagnoses Final diagnoses:  Atypical chest pain  Paresthesia    Rx / DC Orders ED Discharge Orders     None         Tilden Fossa, MD 07/04/23 0451

## 2023-07-04 NOTE — ED Triage Notes (Signed)
Pt comes via [facility] for CP that has been going on for 3 days, L arm numbness and hemoptysis today, PTA received 4mg  zofran

## 2023-07-04 NOTE — ED Notes (Signed)
CT called for STAT scan per Dr. Posey Rea.

## 2023-07-09 HISTORY — PX: WISDOM TOOTH EXTRACTION: SHX21

## 2023-07-13 NOTE — Unmapped (Signed)
 The [facility] has made a second and final attempt to reach this patient to refill the following medication:Dupixent .      We have left voicemails on the following phone numbers: [phone], have been unable to leave messages on the following phone numbers: [phone], have sent a MyChart message, have sent a text message to the following phone numbers: [phone], and have sent a Mychart questionnaire..    Dates contacted: 02/21,02/28  Last scheduled delivery: 01/28    The patient may be at risk of non-compliance with this medication. The patient should call the [facility] at [phone]  Option 4, then Option 2: Dermatology, Gastroenterology, Rheumatology to refill medication.    Robert Allison Samella Parr   [facility]

## 2023-07-15 ENCOUNTER — Ambulatory Visit (HOSPITAL_COMMUNITY): Admission: EM | Admit: 2023-07-15 | Discharge: 2023-07-15 | Disposition: A | Discharge status: Home / Self Care

## 2023-07-15 ENCOUNTER — Encounter (HOSPITAL_COMMUNITY): Payer: Self-pay

## 2023-07-15 DIAGNOSIS — R220 Localized swelling, mass and lump, head: Secondary | ICD-10-CM

## 2023-07-15 MED ORDER — CHLORHEXIDINE GLUCONATE 0.12 % MT SOLN: 15.0000 mL | Freq: Two times a day (BID) | OROMUCOSAL | 0 refills | Status: AC

## 2023-07-15 MED ORDER — AMOXICILLIN-POT CLAVULANATE 875-125 MG PO TABS: 1.0000 | ORAL_TABLET | Freq: Two times a day (BID) | ORAL | 0 refills | Status: AC

## 2023-07-15 MED ORDER — LIDOCAINE VISCOUS HCL 2 % MT SOLN: 15.0000 mL | OROMUCOSAL | 0 refills | Status: AC | PRN

## 2023-07-15 NOTE — ED Triage Notes (Signed)
 Patient presenting with left side mouth pain onset last night. Had alls four wisdom teeth removed 6 days ago. States while eating last night felt something sharp and has been painful since.

## 2023-07-15 NOTE — ED Provider Notes (Signed)
 [facility]    CSN: [id number] Arrival date & time: 07/15/23  1432      History   Chief Complaint Chief Complaint  Patient presents with   Oral Pain   Post-op Problem    HPI Dean Hale is a 18 y.o. male.   Patient states he had his wisdom teeth removed 6 days ago at [facility].  They did not schedule him a follow-up appointment so he presents today due to increased swelling and pain in his left lower surgical site.  He states he feels like something is stabbing him and is concerned that he has a foreign body in the area.  His right wisdom teeth were impacted and he initially had increased swelling on the right side that has since subsided.  He is swelling on the left lower face has worsened over the last couple of days.  He was on clindamycin postoperatively and he has completed that course.  He has not taken anything for pain.  He has not had a fever.   Oral Pain    Past Medical History:  Diagnosis Date   Asthma    Chlamydia    Eosinophilic esophagitis 12/2019    Patient Active Problem List   Diagnosis Date Noted   Failed vision screen 09/05/2013   Constipation 09/05/2013   Encopresis 09/05/2013   Learning disability 09/05/2013   Obesity, unspecified 09/03/2013    Past Surgical History:  Procedure Laterality Date   MYRINGOTOMY WITH TUBE PLACEMENT     WISDOM TOOTH EXTRACTION Bilateral 07/09/2023       Home Medications    Prior to Admission medications   Medication Sig Start Date End Date Taking? Authorizing Provider  amoxicillin-clavulanate (AUGMENTIN) 875-125 MG tablet Take 1 tablet by mouth every 12 (twelve) hours. 07/15/23  Yes Derion Kreiter, Ludger Nutting, NP  chlorhexidine (PERIDEX) 0.12 % solution Use as directed 15 mLs in the mouth or throat 2 (two) times daily. 07/15/23  Yes Elmer Picker, NP  cyproheptadine (PERIACTIN) 4 MG tablet Take 4 mg by mouth in the morning, at noon, and at bedtime. 09/10/20  Yes [provider]  desvenlafaxine (PRISTIQ) 50 MG 24 hr tablet Take 50 mg by mouth daily. 08/15/20  Yes [provider]  lidocaine (XYLOCAINE) 2 % solution Use as directed 15 mLs in the mouth or throat as needed for mouth pain. 07/15/23  Yes Nello Corro, Ludger Nutting, NP  montelukast (SINGULAIR) 10 MG tablet Take 1 tablet (10 mg total) by mouth at bedtime. 02/05/22  Yes Niel Hummer, MD  Multiple Vitamin (MULTIVITAMIN) tablet Take 1 tablet by mouth daily.   Yes [provider]  albuterol (PROVENTIL HFA;VENTOLIN HFA) 108 (90 Base) MCG/ACT inhaler Inhale 1-2 puffs into the lungs every 6 (six) hours as needed for wheezing or shortness of breath. 01/29/18   Reichert, Wyvonnia Dusky, MD  cetirizine (ZYRTEC) 10 MG tablet Take 10 mg by mouth daily.    [provider]  EPINEPHrine 0.3 mg/0.3 mL IJ SOAJ injection Inject 0.3 mg into the muscle as needed for anaphylaxis. 09/16/20   [provider]  pantoprazole (PROTONIX) 40 MG tablet Take 1 tablet (40 mg total) by mouth 2 (two) times daily. 01/12/22 02/11/22  Niel Hummer, MD  SYMBICORT 160-4.5 MCG/ACT inhaler Inhale 1 puff into the lungs 2 (two) times daily.    [provider]    Family History History reviewed. No pertinent family history.  Social History Social History   Tobacco Use   Smoking status: Never  Passive exposure: Yes   Smokeless tobacco: Never  Vaping Use   Vaping status: Never Used  Substance Use Topics   Alcohol use: Never   Drug use: Never     Allergies   Peanut-containing drug products, Almond (diagnostic), Cashew nut oil, Shellfish allergy, Fish allergy, and Insect extract   Review of Systems Review of Systems   Physical Exam Triage Vital Signs ED Triage Vitals  Encounter Vitals Group     BP 07/15/23 1516 124/69     Systolic BP Percentile --      Diastolic BP Percentile --      Pulse Rate 07/15/23 1516 80     Resp 07/15/23 1516 16     Temp 07/15/23 1516 97.9 F (36.6 C)     Temp Source 07/15/23 1516 Oral      SpO2 07/15/23 1516 98 %     Weight 07/15/23 1515 148 lb (67.1 kg)     Height 07/15/23 1515 5\' 4"  (1.626 m)     Head Circumference --      Peak Flow --      Pain Score 07/15/23 1512 9     Pain Loc --      Pain Education --      Exclude from Growth Chart --    No data found.  Updated Vital Signs BP 124/69 (BP Location: Right Arm)   Pulse 80   Temp 97.9 F (36.6 C) (Oral)   Resp 16   Ht 5\' 4"  (1.626 m)   Wt 148 lb (67.1 kg)   SpO2 98%   BMI 25.40 kg/m   Visual Acuity Right Eye Distance:   Left Eye Distance:   Bilateral Distance:    Right Eye Near:   Left Eye Near:    Bilateral Near:     Physical Exam Vitals and nursing note reviewed.  Constitutional:      General: He is not in acute distress.    Appearance: He is well-developed.  HENT:     Head: Normocephalic and atraumatic.     Jaw: Tenderness and swelling present.     Comments: Swelling noted to the left face at the the site of the jaw    Mouth/Throat:   Eyes:     Conjunctiva/sclera: Conjunctivae normal.  Cardiovascular:     Rate and Rhythm: Normal rate and regular rhythm.     Heart sounds: No murmur heard. Pulmonary:     Effort: Pulmonary effort is normal. No respiratory distress.     Breath sounds: Normal breath sounds.  Abdominal:     Palpations: Abdomen is soft.     Tenderness: There is no abdominal tenderness.  Musculoskeletal:        General: No swelling.     Cervical back: Neck supple.  Skin:    General: Skin is warm and dry.     Capillary Refill: Capillary refill takes less than 2 seconds.  Neurological:     Mental Status: He is alert.  Psychiatric:        Mood and Affect: Mood normal.      UC Treatments / Results  Labs (all labs ordered are listed, but only abnormal results are displayed) Labs Reviewed - No data to display  EKG   Radiology No results found.  Procedures Procedures (including critical care time)  Medications Ordered in UC Medications - No data to  display  Initial Impression / Assessment and Plan / UC Course  I have reviewed the triage vital signs and the  nursing notes.  Pertinent labs & imaging results that were available during my care of the patient were reviewed by me and considered in my medical decision making (see chart for details).  Patient presents with postsurgical pain and swelling.  There is concern for infection as his swelling has worsened over the last couple of days.  He does have an exposed suture in his left lower surgical site with a pretty big pocket.  Peridex, antibiotic, and viscous lidocaine provided for management.  Patient will call oral surgeon in the next couple of days if his symptoms do not start to improve.  Additionally provided him with syringes to flush the pocket to try to avoid dry socket.  Patient in agreement with plan.         Final Clinical Impressions(s) / UC Diagnoses   Final diagnoses:  Left facial swelling     Discharge Instructions      Please take antibiotics as prescribed, use Peridexswish and spit as prescribed. Viscous lidocaine as needed for mouth pain.  Please follow-up with oral surgeon if symptoms are not improving over the next 3 to 4 days.  Use provided syringe to flush out pocket from left lower wisdom tooth removal.  You do have an exposed suture that should dissolve on its own please do not pull on suture.     ED Prescriptions     Medication Sig Dispense Auth. Provider   amoxicillin-clavulanate (AUGMENTIN) 875-125 MG tablet Take 1 tablet by mouth every 12 (twelve) hours. 14 tablet Pamalee Leyden, [place], NP   chlorhexidine (PERIDEX) 0.12 % solution Use as directed 15 mLs in the mouth or throat 2 (two) times daily. 120 mL Elmer Picker, NP   lidocaine (XYLOCAINE) 2 % solution Use as directed 15 mLs in the mouth or throat as needed for mouth pain. 15 mL Elmer Picker, NP      PDMP not reviewed this encounter.   Elmer Picker, NP 07/15/23 [phone]

## 2023-07-15 NOTE — Discharge Instructions (Addendum)
 Please take antibiotics as prescribed, use Peridexswish and spit as prescribed. Viscous lidocaine as needed for mouth pain.  Please follow-up with oral surgeon if symptoms are not improving over the next 3 to 4 days.  Use provided syringe to flush out pocket from left lower wisdom tooth removal.  You do have an exposed suture that should dissolve on its own please do not pull on suture.

## 2023-07-31 NOTE — Unmapped (Signed)
 [facility] Clinical Assessment & Refill Coordination Note    Robert Allison, DOB: [birth year]  Phone: [phone] (home)     All above HIPAA information was verified with patient's family member, father, Robert Allison.     Was a Nurse, learning disability used for this call? No    Specialty Medication(s):   Inflammatory Disorders: Dupixent     Current Outpatient Medications   Medication Sig Dispense Refill    albuterol (VENTOLIN HFA) 90 mcg/actuation inhaler Inhale 2 puffs every four (4) hours as needed for wheezing. 54 g 11    cetirizine (ZYRTEC) 10 MG tablet Take 1 tablet (10 mg total) by mouth daily.      clindamycin-benzoyl peroxide (BENZACLIN) 1-5 % gel APPLY TO BACK ONE TO TWO TIMES PER DAY      desvenlafaxine (PRISTIQ) 50 MG 24 hr tablet Take 1 tablet (50 mg total) by mouth daily.      dupilumab (DUPIXENT PEN) 300 mg/2 mL PnIj Inject the contents of 1 pen (300 mg) under the skin every seven (7) days. 8 mL 11    EPINEPHrine (EPIPEN) 0.3 mg/0.3 mL injection Inject 0.3 mL (0.3 mg total) into the muscle once as needed for anaphylaxis for up to 2 doses. 4 each 4    fluticasone propionate (FLOVENT DISKUS) 100 mcg/actuation DsDv diskus Inhale 2 puffs Two (2) times a day. 60 each 11    food supplemt, lactose-reduced (ENSURE PLUS) 0.05-1.5 gram-kcal/mL liquid Take 720 mL by mouth daily. 22320 mL 11    inhalational spacing device (AEROCHAMBER MV) Spcr 2 each by Miscellaneous route two (2) times a day. 2 each 4    montelukast (SINGULAIR) 10 mg tablet Take 1 tablet (10 mg total) by mouth.      multivitamin (TAB-A-VITE/THERAGRAN) per tablet Take 1 tablet by mouth daily.      omeprazole (PRILOSEC) 20 MG capsule Take 1 capsule (20 mg total) by mouth Two (2) times a day (30 minutes before a meal). May swallow capsule or open into spoonful of applesauce to swallow. Give on empty stomach. Eat 30-60 minutes later. 60 capsule 3    pantoprazole (PROTONIX) 40 MG tablet Take 1 tablet (40 mg total) by mouth daily. tretinoin (RETIN-A) 0.025 % cream        No current facility-administered medications for this visit.        Changes to medications: Jimie reports no changes at this time.    Medication list has been reviewed and updated in Epic: Yes    Allergies   Allergen Reactions    Cashew Nut Hives    Fish Containing Products Swelling    Shellfish Containing Products Swelling       Changes to allergies: No    Allergies have been reviewed and updated in Epic: Yes    SPECIALTY MEDICATION ADHERENCE     Dupixent 300  mg/38mL : 1 doses of medicine on hand        Specialty medication(s) dose(s) confirmed: Regimen is correct and unchanged.     Are there any concerns with adherence? No    Adherence counseling provided? Not needed    CLINICAL MANAGEMENT AND INTERVENTION      Clinical Benefit Assessment:    Do you feel the medicine is effective or helping your condition? Yes    Clinical Benefit counseling provided? Not needed    Adverse Effects Assessment:    Are you experiencing any side effects? No    Are you experiencing difficulty administering your medicine? No  Quality of Life Assessment:    Quality of Life    Rheumatology  Oncology  Dermatology  Cystic Fibrosis          How many days over the past month did your EoE  keep you from your normal activities? For example, brushing your teeth or getting up in the morning. 0    Have you discussed this with your provider? Not needed    Acute Infection Status:    Acute infections noted within Epic:  No active infections    Patient reported infection: None    Therapy Appropriateness:    Is therapy appropriate based on current medication list, adverse reactions, adherence, clinical benefit and progress toward achieving therapeutic goals? Yes, therapy is appropriate and should be continued     Clinical Intervention:    Was an intervention completed as part of this clinical assessment? No    DISEASE/MEDICATION-SPECIFIC INFORMATION      For patients on injectable medications: Patient currently has 1 doses left.  Next injection is scheduled for 3/19.    Chronic Inflammatory Diseases: Have you experienced any flares in the last month? No  Has this been reported to your provider? Not applicable    PATIENT SPECIFIC NEEDS     Does the patient have any physical, cognitive, or cultural barriers? No    Is the patient high risk? Yes, pediatric patient. Contraindications and appropriate dosing have been assessed    Does the patient require physician intervention or other additional services (i.e., nutrition, smoking cessation, social work)? No    Does the patient have an additional or emergency contact listed in their chart? Yes    SOCIAL DETERMINANTS OF HEALTH     At the [facility], we have learned that life circumstances - like trouble affording food, housing, utilities, or transportation can affect the health of many of our patients.   That is why we wanted to ask: are you currently experiencing any life circumstances that are negatively impacting your health and/or quality of life? No    Social Drivers of Health     Food Insecurity: Not on file   Tobacco Use: Medium Risk (12/13/2022)    Received from [facility]    Patient History     Smoking Tobacco Use: Never     Smokeless Tobacco Use: Never     Passive Exposure: Yes   Transportation Needs: Not on file   Alcohol Use: Not on file   Housing: Not on file   Physical Activity: Not on file   Utilities: Not on file   Stress: Not on file   Interpersonal Safety: Not on file   Substance Use: Not on file (03/21/2023)   Intimate Partner Violence: Unknown (08/18/2021)    Received from [facility]    HITS     Physically Hurt: Not on file     Insult or Talk Down To: Not on file     Threaten Physical Harm: Not on file     Scream or Curse: Not on file   Social Connections: Unknown (09/26/2021)    Received from [facility]    Social Network     Social Network: Not on file   Financial Resource Strain: Not on file   Depression: Not at risk (07/04/2018)    Received from [facility]    Depression     Depression Screening: 0   Internet Connectivity: Not on file   Health Literacy: Low Risk  (02/21/2022)  Health Literacy     : Never       Would you be willing to receive help with any of the needs that you have identified today? Not applicable       SHIPPING     Specialty Medication(s) to be Shipped:   Inflammatory Disorders: Dupixent    Other medication(s) to be shipped: No additional medications requested for fill at this time     Changes to insurance: No    Cost and Payment: Patient has a $0 copay, payment information is not required.    Delivery Scheduled: Yes, Expected medication delivery date: 3/20.     Medication will be delivered via UPS to the confirmed prescription address in [facility]    The patient will receive a drug information handout for each medication shipped and additional FDA Medication Guides as required.  Verified that patient has previously received a Conservation officer, historic buildings and a Surveyor, mining.    The patient or caregiver noted above participated in the development of this care plan and knows that they can request review of or adjustments to the care plan at any time.      All of the patient's questions and concerns have been addressed.    Teofilo Pod, PharmD   [facility] Specialty Pharmacist

## 2023-08-01 MED FILL — DUPIXENT 300 MG/2 ML SUBCUTANEOUS PEN INJECTOR: SUBCUTANEOUS | 56 days supply | Qty: 16 | Fill #2

## 2023-08-07 ENCOUNTER — Other Ambulatory Visit: Payer: Self-pay

## 2023-08-07 ENCOUNTER — Encounter (HOSPITAL_COMMUNITY): Payer: Self-pay | Admitting: Emergency Medicine

## 2023-08-07 ENCOUNTER — Emergency Department (HOSPITAL_COMMUNITY)

## 2023-08-07 ENCOUNTER — Emergency Department (HOSPITAL_COMMUNITY)
Admission: EM | Admit: 2023-08-07 | Discharge: 2023-08-08 | Disposition: A | Discharge status: Home / Self Care | Attending: Emergency Medicine | Admitting: Emergency Medicine

## 2023-08-07 DIAGNOSIS — R0789 Other chest pain: Secondary | ICD-10-CM | POA: Diagnosis present

## 2023-08-07 DIAGNOSIS — E860 Dehydration: Secondary | ICD-10-CM | POA: Diagnosis not present

## 2023-08-07 DIAGNOSIS — R112 Nausea with vomiting, unspecified: Secondary | ICD-10-CM | POA: Diagnosis present

## 2023-08-07 DIAGNOSIS — R000 Tachycardia, unspecified: Secondary | ICD-10-CM | POA: Diagnosis not present

## 2023-08-07 DIAGNOSIS — Z91010 Allergy to peanuts: Secondary | ICD-10-CM | POA: Diagnosis not present

## 2023-08-07 DIAGNOSIS — R079 Chest pain, unspecified: Secondary | ICD-10-CM | POA: Diagnosis not present

## 2023-08-07 DIAGNOSIS — R202 Paresthesia of skin: Secondary | ICD-10-CM | POA: Diagnosis not present

## 2023-08-07 DIAGNOSIS — J45909 Unspecified asthma, uncomplicated: Secondary | ICD-10-CM | POA: Diagnosis not present

## 2023-08-07 DIAGNOSIS — R197 Diarrhea, unspecified: Secondary | ICD-10-CM | POA: Diagnosis not present

## 2023-08-07 DIAGNOSIS — Z7951 Long term (current) use of inhaled steroids: Secondary | ICD-10-CM | POA: Diagnosis not present

## 2023-08-07 LAB — COMPREHENSIVE METABOLIC PANEL
ALT: 39 U/L (ref 0–44)
AST: 32 U/L (ref 15–41)
Albumin: 4.5 g/dL (ref 3.5–5.0)
Alkaline Phosphatase: 54 U/L (ref 38–126)
Anion gap: 10 (ref 5–15)
BUN: 11 mg/dL (ref 6–20)
CO2: 26 mmol/L (ref 22–32)
Calcium: 9.4 mg/dL (ref 8.9–10.3)
Chloride: 101 mmol/L (ref 98–111)
Creatinine, Ser: 0.77 mg/dL (ref 0.61–1.24)
GFR, Estimated: >60 mL/min (ref >60)
Glucose, Bld: 105 mg/dL — ABNORMAL HIGH (ref 70–99)
Potassium: 3.6 mmol/L (ref 3.5–5.1)
Sodium: 137 mmol/L (ref 135–145)
Total Bilirubin: 0.7 mg/dL (ref 0.0–1.2)
Total Protein: 7.4 g/dL (ref 6.5–8.1)

## 2023-08-07 LAB — CBC
HCT: 45.8 % (ref 39.0–52.0)
Hemoglobin: 15.4 g/dL (ref 13.0–17.0)
MCH: 29.3 pg (ref 26.0–34.0)
MCHC: 33.6 g/dL (ref 30.0–36.0)
MCV: 87.2 fL (ref 80.0–100.0)
Platelets: 269 10*3/uL (ref 150–400)
RBC: 5.25 MIL/uL (ref 4.22–5.81)
RDW: 12 % (ref 11.5–15.5)
WBC: 6.4 10*3/uL (ref 4.0–10.5)
nRBC: 0 % (ref 0.0–0.2)

## 2023-08-07 LAB — TROPONIN I (HIGH SENSITIVITY): Troponin I (High Sensitivity): <2 ng/L (ref <18)
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
## 2023-08-08 ENCOUNTER — Emergency Department (HOSPITAL_COMMUNITY)
Admission: EM | Admit: 2023-08-08 | Discharge: 2023-08-08 | Disposition: A | Discharge status: Home / Self Care | Source: Home / Self Care | Attending: Emergency Medicine | Admitting: Emergency Medicine

## 2023-08-08 ENCOUNTER — Other Ambulatory Visit: Payer: Self-pay

## 2023-08-08 ENCOUNTER — Encounter (HOSPITAL_COMMUNITY): Payer: Self-pay

## 2023-08-08 ENCOUNTER — Emergency Department (HOSPITAL_COMMUNITY)

## 2023-08-08 DIAGNOSIS — R079 Chest pain, unspecified: Secondary | ICD-10-CM | POA: Insufficient documentation

## 2023-08-08 DIAGNOSIS — Z91010 Allergy to peanuts: Secondary | ICD-10-CM | POA: Insufficient documentation

## 2023-08-08 DIAGNOSIS — R197 Diarrhea, unspecified: Secondary | ICD-10-CM | POA: Insufficient documentation

## 2023-08-08 DIAGNOSIS — J45909 Unspecified asthma, uncomplicated: Secondary | ICD-10-CM | POA: Insufficient documentation

## 2023-08-08 DIAGNOSIS — E860 Dehydration: Secondary | ICD-10-CM | POA: Insufficient documentation

## 2023-08-08 DIAGNOSIS — R112 Nausea with vomiting, unspecified: Secondary | ICD-10-CM | POA: Insufficient documentation

## 2023-08-08 DIAGNOSIS — R000 Tachycardia, unspecified: Secondary | ICD-10-CM | POA: Insufficient documentation

## 2023-08-08 DIAGNOSIS — Z7951 Long term (current) use of inhaled steroids: Secondary | ICD-10-CM | POA: Insufficient documentation

## 2023-08-08 HISTORY — DX: Anxiety disorder, unspecified: F41.9

## 2023-08-08 LAB — HEPATIC FUNCTION PANEL
ALT: 49 U/L — ABNORMAL HIGH (ref 0–44)
AST: 34 U/L (ref 15–41)
Albumin: 5 g/dL (ref 3.5–5.0)
Alkaline Phosphatase: 65 U/L (ref 38–126)
Bilirubin, Direct: 0.2 mg/dL (ref 0.0–0.2)
Indirect Bilirubin: 1 mg/dL — ABNORMAL HIGH (ref 0.3–0.9)
Total Bilirubin: 1.2 mg/dL (ref 0.0–1.2)
Total Protein: 8.4 g/dL — ABNORMAL HIGH (ref 6.5–8.1)

## 2023-08-08 LAB — BASIC METABOLIC PANEL
Anion gap: 14 (ref 5–15)
BUN: 14 mg/dL (ref 6–20)
CO2: 22 mmol/L (ref 22–32)
Calcium: 10.7 mg/dL — ABNORMAL HIGH (ref 8.9–10.3)
Chloride: 104 mmol/L (ref 98–111)
Creatinine, Ser: 0.96 mg/dL (ref 0.61–1.24)
GFR, Estimated: >60 mL/min (ref >60)
Glucose, Bld: 89 mg/dL (ref 70–99)
Potassium: 4.9 mmol/L (ref 3.5–5.1)
Sodium: 140 mmol/L (ref 135–145)

## 2023-08-08 LAB — CBC
HCT: 54.2 % — ABNORMAL HIGH (ref 39.0–52.0)
Hemoglobin: 18.3 g/dL — ABNORMAL HIGH (ref 13.0–17.0)
MCH: 29.6 pg (ref 26.0–34.0)
MCHC: 33.8 g/dL (ref 30.0–36.0)
MCV: 87.7 fL (ref 80.0–100.0)
Platelets: 278 10*3/uL (ref 150–400)
RBC: 6.18 MIL/uL — ABNORMAL HIGH (ref 4.22–5.81)
RDW: 12.1 % (ref 11.5–15.5)
WBC: 14.3 10*3/uL — ABNORMAL HIGH (ref 4.0–10.5)
nRBC: 0 % (ref 0.0–0.2)

## 2023-08-08 LAB — RESP PANEL BY RT-PCR (RSV, FLU A&B, COVID)  RVPGX2
Influenza A by PCR: NEGATIVE
Influenza A by PCR: NEGATIVE
Influenza B by PCR: NEGATIVE
Influenza B by PCR: NEGATIVE
Resp Syncytial Virus by PCR: NEGATIVE
Resp Syncytial Virus by PCR: NEGATIVE
SARS Coronavirus 2 by RT PCR: NEGATIVE
SARS Coronavirus 2 by RT PCR: NEGATIVE

## 2023-08-08 LAB — TROPONIN I (HIGH SENSITIVITY)
Troponin I (High Sensitivity): <2 ng/L (ref <18)
Troponin I (High Sensitivity): <2 ng/L (ref <18)

## 2023-08-08 MED ORDER — SODIUM CHLORIDE 0.9 % IV BOLUS
1000.0000 mL | Freq: Once | INTRAVENOUS | Status: AC
  Administered 2023-08-08: 1000 mL via INTRAVENOUS

## 2023-08-08 MED ORDER — ALUM & MAG HYDROXIDE-SIMETH 200-200-20 MG/5ML PO SUSP
30.0000 mL | Freq: Once | ORAL | Status: AC
  Administered 2023-08-08: 30 mL via ORAL
  Filled 2023-08-08: qty 30

## 2023-08-08 MED ORDER — ONDANSETRON 4 MG PO TBDP
4.0000 mg | ORAL_TABLET | Freq: Once | ORAL | Status: AC
  Administered 2023-08-08: 4 mg via ORAL
  Filled 2023-08-08: qty 1

## 2023-08-08 MED ORDER — ONDANSETRON 4 MG PO TBDP: 4.0000 mg | ORAL_TABLET | Freq: Three times a day (TID) | ORAL | 0 refills | Status: AC | PRN

## 2023-08-08 MED ORDER — LORAZEPAM 1 MG PO TABS
1.0000 mg | ORAL_TABLET | Freq: Once | ORAL | Status: AC | PRN
  Administered 2023-08-08: 1 mg via ORAL
  Filled 2023-08-08: qty 1

## 2023-08-08 MED ORDER — SODIUM CHLORIDE 0.9 % IV SOLN
25.0000 mg | Freq: Once | INTRAVENOUS | Status: AC
  Administered 2023-08-08: 25 mg via INTRAVENOUS
  Filled 2023-08-08: qty 1

## 2023-08-08 MED ORDER — ONDANSETRON HCL 4 MG/2ML IJ SOLN
4.0000 mg | Freq: Once | INTRAMUSCULAR | Status: AC
  Administered 2023-08-08: 4 mg via INTRAVENOUS
  Filled 2023-08-08: qty 2
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
## 2023-08-08 NOTE — ED Notes (Signed)
 Pt transported to MRI
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
## 2023-08-08 NOTE — ED Provider Triage Note (Signed)
 Emergency Medicine Provider Triage Evaluation Note  Juquan Reznick , a 18 y.o. male  was evaluated in triage.  Pt complains of ongoing chest pain with new nausea, vomiting, diarrhea. Seen at [facility] yesterday for similar minus abdominal symptoms with reassuring workup. Discharged less than 24 hours ago.  Review of Systems  Positive: Chest pain, NVD Negative: shob  Physical Exam  BP 120/88   Pulse (!) 109   Temp 98.4 F (36.9 C) (Oral)   Resp 16   Ht 5\' 4"  (1.626 m)   Wt 68 kg   SpO2 100%   BMI 25.73 kg/m  Gen:   Awake, no distress   Resp:  Normal effort  MSK:   Moves extremities without difficulty  Other:  Mild tachycardia, normal rhythm  Medical Decision Making  Medically screening exam initiated at 5:37 PM.  Appropriate orders placed.  Corney Knighton was informed that the remainder of the evaluation will be completed by another provider, this initial triage assessment does not replace that evaluation, and the importance of remaining in the ED until their evaluation is complete.  Workup initiated in triage    Olene Floss, [place] 08/08/23 1739

## 2023-08-09 NOTE — ED Provider Notes (Signed)
 [place] EMERGENCY DEPARTMENT AT [facility] Provider Note   CSN: [id number] Arrival date & time: 08/08/23  1721     History  Chief Complaint  Patient presents with   Chest Pain    Dean Hale is a 18 y.o. male.  Pt is a 18 yo male with pmhx significant for asthma, EOE, and anxiety.  He presents to the ED today with cp and n/v/d.  Pt was seen at [facility] early this am with the cp which started after an argument.  Eval neg and he went home.  He started having the n/v/d, so he came back.  He said he passed out in the [facility] when he stood up.  Pt feels very nauseous now.       Home Medications Prior to Admission medications   Medication Sig Start Date End Date Taking? Authorizing Provider  ondansetron (ZOFRAN-ODT) 4 MG disintegrating tablet Take 1 tablet (4 mg total) by mouth every 8 (eight) hours as needed. 08/08/23  Yes Jacalyn Lefevre, MD  albuterol (PROVENTIL HFA;VENTOLIN HFA) 108 (90 Base) MCG/ACT inhaler Inhale 1-2 puffs into the lungs every 6 (six) hours as needed for wheezing or shortness of breath. 01/29/18   Charlett Nose, MD  amoxicillin-clavulanate (AUGMENTIN) 875-125 MG tablet Take 1 tablet by mouth every 12 (twelve) hours. 07/15/23   Elmer Picker, NP  cetirizine (ZYRTEC) 10 MG tablet Take 10 mg by mouth daily.    [provider]  chlorhexidine (PERIDEX) 0.12 % solution Use as directed 15 mLs in the mouth or throat 2 (two) times daily. 07/15/23   Elmer Picker, NP  cyproheptadine (PERIACTIN) 4 MG tablet Take 4 mg by mouth in the morning, at noon, and at bedtime. 09/10/20   [provider]  desvenlafaxine (PRISTIQ) 50 MG 24 hr tablet Take 50 mg by mouth daily. 08/15/20   [provider]  EPINEPHrine 0.3 mg/0.3 mL IJ SOAJ injection Inject 0.3 mg into the muscle as needed for anaphylaxis. 09/16/20   [provider]  lidocaine (XYLOCAINE) 2 % solution Use as directed 15 mLs in the mouth or throat as needed for mouth pain. 07/15/23   Elmer Picker, NP  montelukast (SINGULAIR) 10 MG tablet Take 1 tablet (10 mg total) by mouth at bedtime. 02/05/22   Niel Hummer, MD  Multiple Vitamin (MULTIVITAMIN) tablet Take 1 tablet by mouth daily.    [provider]  pantoprazole (PROTONIX) 40 MG tablet Take 1 tablet (40 mg total) by mouth 2 (two) times daily. 01/12/22 02/11/22  Niel Hummer, MD  SYMBICORT 160-4.5 MCG/ACT inhaler Inhale 1 puff into the lungs 2 (two) times daily.    [provider]      Allergies    Peanut-containing drug products, Almond (diagnostic), Cashew nut oil, Shellfish allergy, Fish allergy, and Insect extract    Review of Systems   Review of Systems  Gastrointestinal:  Positive for diarrhea, nausea and vomiting.  Neurological:  Positive for weakness.  All other systems reviewed and are negative.   Physical Exam Updated Vital Signs BP 114/64   Pulse (!) 126 Comment: MD aware  Temp 99.6 F (37.6 C)   Resp 18   Ht 5\' 4"  (1.626 m)   Wt 68 kg   SpO2 100%   BMI 25.73 kg/m  Physical Exam Vitals and nursing note reviewed.  Constitutional:      Appearance: He is well-developed.  HENT:     Head: Normocephalic and atraumatic.     Mouth/Throat:  Mouth: Mucous membranes are dry.  Eyes:     Extraocular Movements: Extraocular movements intact.     Pupils: Pupils are equal, round, and reactive to light.  Cardiovascular:     Rate and Rhythm: Regular rhythm. Tachycardia present.     Heart sounds: Normal heart sounds.  Pulmonary:     Effort: Pulmonary effort is normal.     Breath sounds: Normal breath sounds.  Abdominal:     General: Bowel sounds are normal.     Palpations: Abdomen is soft.  Musculoskeletal:        General: Normal range of motion.     Cervical back: Normal range of motion and neck supple.  Skin:    General: Skin is warm.     Capillary Refill: Capillary refill takes less than 2 seconds.  Neurological:     General: No focal deficit present.     Mental Status: He is alert  and oriented to person, place, and time.  Psychiatric:        Mood and Affect: Mood normal.        Behavior: Behavior normal.     ED Results / Procedures / Treatments   Labs (all labs ordered are listed, but only abnormal results are displayed) Labs Reviewed  BASIC METABOLIC PANEL - Abnormal; Notable for the following components:      Result Value   Calcium 10.7 (*)    All other components within normal limits  CBC - Abnormal; Notable for the following components:   WBC 14.3 (*)    RBC 6.18 (*)    Hemoglobin 18.3 (*)    HCT 54.2 (*)    All other components within normal limits  HEPATIC FUNCTION PANEL - Abnormal; Notable for the following components:   Total Protein 8.4 (*)    ALT 49 (*)    Indirect Bilirubin 1.0 (*)    All other components within normal limits  RESP PANEL BY RT-PCR (RSV, FLU A&B, COVID)  RVPGX2  TROPONIN I (HIGH SENSITIVITY)    EKG EKG Interpretation Date/Time:  Wednesday August 08 2023 17:22:48 EDT Ventricular Rate:  118 PR Interval:  134 QRS Duration:  76 QT Interval:  306 QTC Calculation: 428 R Axis:   82  Text Interpretation: Sinus tachycardia Right atrial enlargement Cannot rule out Anterior infarct , age undetermined Abnormal ECG When compared with ECG of 07-Aug-2023 22:44, PREVIOUS ECG IS PRESENT No significant change since last tracing Confirmed by Vanetta Mulders [phone]) on 08/08/2023 5:28:09 PM  Radiology MR BRAIN WO CONTRAST Result Date: 08/08/2023 CLINICAL DATA:  Neuro deficit, acute, stroke suspected left sided numbness; Myelopathy, acute, cervical spine EXAM: MRI HEAD WITHOUT CONTRAST MRI CERVICAL SPINE WITHOUT CONTRAST TECHNIQUE: Multiplanar, multiecho pulse sequences of the brain and surrounding structures, and cervical spine, to include the craniocervical junction and cervicothoracic junction, were obtained without intravenous contrast. COMPARISON:  CT head and CT cervical spine July 04, 2023. FINDINGS: MRI HEAD FINDINGS Brain: No acute  infarction, hemorrhage, hydrocephalus, extra-axial collection or mass lesion. Vascular: Major arterial flow voids are maintained at the skull base Skull and upper cervical spine: Normal marrow signal. Sinuses/Orbits: Negative. Other: No mastoid effusions. MRI CERVICAL SPINE FINDINGS Alignment: Normal. Vertebrae: No fracture, evidence of discitis, or bone lesion. Cord: Normal cord signal Posterior Fossa, vertebral arteries, paraspinal tissues: Digitalized vertebral artery flow voids are maintained. No paraspinal edema. Disc levels: No significant disc protrusion, foraminal stenosis, or canal stenosis. IMPRESSION: 1. Normal brain MRI.  No acute intracranial abnormality. 2. Unremarkable MRI of  the cervical spine.  No significant stenosis. Electronically Signed   By: Feliberto Harts M.D.   On: 08/08/2023 03:53   MR Cervical Spine Wo Contrast Result Date: 08/08/2023 CLINICAL DATA:  Neuro deficit, acute, stroke suspected left sided numbness; Myelopathy, acute, cervical spine EXAM: MRI HEAD WITHOUT CONTRAST MRI CERVICAL SPINE WITHOUT CONTRAST TECHNIQUE: Multiplanar, multiecho pulse sequences of the brain and surrounding structures, and cervical spine, to include the craniocervical junction and cervicothoracic junction, were obtained without intravenous contrast. COMPARISON:  CT head and CT cervical spine July 04, 2023. FINDINGS: MRI HEAD FINDINGS Brain: No acute infarction, hemorrhage, hydrocephalus, extra-axial collection or mass lesion. Vascular: Major arterial flow voids are maintained at the skull base Skull and upper cervical spine: Normal marrow signal. Sinuses/Orbits: Negative. Other: No mastoid effusions. MRI CERVICAL SPINE FINDINGS Alignment: Normal. Vertebrae: No fracture, evidence of discitis, or bone lesion. Cord: Normal cord signal Posterior Fossa, vertebral arteries, paraspinal tissues: Digitalized vertebral artery flow voids are maintained. No paraspinal edema. Disc levels: No significant disc  protrusion, foraminal stenosis, or canal stenosis. IMPRESSION: 1. Normal brain MRI.  No acute intracranial abnormality. 2. Unremarkable MRI of the cervical spine.  No significant stenosis. Electronically Signed   By: Feliberto Harts M.D.   On: 08/08/2023 03:53   DG Chest 2 View Result Date: 08/07/2023 CLINICAL DATA:  Chest pain EXAM: CHEST - 2 VIEW COMPARISON:  07/04/2023 FINDINGS: The heart size and mediastinal contours are within normal limits. Both lungs are clear. The visualized skeletal structures are unremarkable. IMPRESSION: No active cardiopulmonary disease. Electronically Signed   By: Jasmine Pang M.D.   On: 08/07/2023 23:05    Procedures Procedures    Medications Ordered in ED Medications  ondansetron (ZOFRAN-ODT) disintegrating tablet 4 mg (4 mg Oral Given 08/08/23 1750)  sodium chloride 0.9 % bolus 1,000 mL (0 mLs Intravenous Stopped 08/08/23 2229)  ondansetron (ZOFRAN) injection 4 mg (4 mg Intravenous Given 08/08/23 1925)  sodium chloride 0.9 % bolus 1,000 mL (0 mLs Intravenous Stopped 08/08/23 2229)  promethazine (PHENERGAN) 25 mg in sodium chloride 0.9 % 50 mL IVPB (0 mg Intravenous Stopped 08/08/23 2159)  alum & mag hydroxide-simeth (MAALOX/MYLANTA) 200-200-20 MG/5ML suspension 30 mL (30 mLs Oral Given 08/08/23 2146)    ED Course/ Medical Decision Making/ A&P                                 Medical Decision Making Amount and/or Complexity of Data Reviewed Labs: ordered.  Risk OTC drugs. Prescription drug management.   This patient presents to the ED for concern of n/v/d, this involves an extensive number of treatment options, and is a complaint that carries with it a high risk of complications and morbidity.  The differential diagnosis includes electrolyte abn, infection, anxiety   Co morbidities that complicate the patient evaluation  asthma, EOE, and anxiety.   Additional history obtained:  Additional history obtained from epic chart review External records  from outside source obtained and reviewed including family   Lab Tests:  I Ordered, and personally interpreted labs.  The pertinent results include:  covid/flu/rsv neg, cbc with wbc elevated at 14.3, bmp nl, lfts nl, trop nl   Imaging Studies ordered:  I reviewed films from earlier today. I agree with the radiologist interpretation  Medicines ordered and prescription drug management:  I ordered medication including ivfs/phenergan/zofran  for sx  Reevaluation of the patient after these medicines showed that the patient improved I  have reviewed the patients home medicines and have made adjustments as needed   Test Considered:  ct   Problem List / ED Course:  N/v/d + dehydration:  pt is feeling much better.  He is tolerating fluids and crackers after 2nd L NS and phenergan.  He wants to go home.  He knows to return if worse.  F/u with pcp.   Reevaluation:  After the interventions noted above, I reevaluated the patient and found that they have :improved   Social Determinants of Health:  Lives at home   Dispostion:  After consideration of the diagnostic results and the patients response to treatment, I feel that the patent would benefit from discharge with outpatient f/u.          Final Clinical Impression(s) / ED Diagnoses Final diagnoses:  Nausea and vomiting, unspecified vomiting type  Dehydration    Rx / DC Orders ED Discharge Orders          Ordered    ondansetron (ZOFRAN-ODT) 4 MG disintegrating tablet  Every 8 hours PRN        08/08/23 2241              Jacalyn Lefevre, MD 08/09/23 1742

## 2023-08-13 DIAGNOSIS — K200 Eosinophilic esophagitis: Principal | ICD-10-CM

## 2023-09-13 ENCOUNTER — Emergency Department (HOSPITAL_COMMUNITY)
Admission: EM | Admit: 2023-09-13 | Discharge: 2023-09-13 | Disposition: A | Discharge status: Home / Self Care | Attending: Emergency Medicine | Admitting: Emergency Medicine

## 2023-09-13 ENCOUNTER — Encounter (HOSPITAL_COMMUNITY): Payer: Self-pay

## 2023-09-13 ENCOUNTER — Emergency Department (HOSPITAL_COMMUNITY): Admitting: Certified Registered Nurse Anesthetist

## 2023-09-13 ENCOUNTER — Encounter (HOSPITAL_COMMUNITY)
Admission: EM | Disposition: A | Discharge status: Home / Self Care | Payer: Self-pay | Source: Home / Self Care | Attending: Emergency Medicine

## 2023-09-13 ENCOUNTER — Emergency Department (EMERGENCY_DEPARTMENT_HOSPITAL): Admitting: Certified Registered Nurse Anesthetist

## 2023-09-13 ENCOUNTER — Other Ambulatory Visit: Payer: Self-pay

## 2023-09-13 DIAGNOSIS — K200 Eosinophilic esophagitis: Secondary | ICD-10-CM | POA: Insufficient documentation

## 2023-09-13 DIAGNOSIS — Z91010 Allergy to peanuts: Secondary | ICD-10-CM | POA: Diagnosis not present

## 2023-09-13 DIAGNOSIS — F419 Anxiety disorder, unspecified: Secondary | ICD-10-CM | POA: Diagnosis not present

## 2023-09-13 DIAGNOSIS — K920 Hematemesis: Secondary | ICD-10-CM | POA: Insufficient documentation

## 2023-09-13 DIAGNOSIS — K2289 Other specified disease of esophagus: Secondary | ICD-10-CM | POA: Diagnosis not present

## 2023-09-13 DIAGNOSIS — K319 Disease of stomach and duodenum, unspecified: Secondary | ICD-10-CM | POA: Diagnosis not present

## 2023-09-13 DIAGNOSIS — J4489 Other specified chronic obstructive pulmonary disease: Secondary | ICD-10-CM | POA: Diagnosis not present

## 2023-09-13 DIAGNOSIS — K2950 Unspecified chronic gastritis without bleeding: Secondary | ICD-10-CM | POA: Insufficient documentation

## 2023-09-13 DIAGNOSIS — K209 Esophagitis, unspecified without bleeding: Secondary | ICD-10-CM

## 2023-09-13 HISTORY — PX: ESOPHAGOGASTRODUODENOSCOPY: SHX5428

## 2023-09-13 LAB — COMPREHENSIVE METABOLIC PANEL WITH GFR
ALT: 32 U/L (ref 0–44)
AST: 30 U/L (ref 15–41)
Albumin: 4.5 g/dL (ref 3.5–5.0)
Alkaline Phosphatase: 58 U/L (ref 38–126)
Anion gap: 10 (ref 5–15)
BUN: 9 mg/dL (ref 6–20)
CO2: 25 mmol/L (ref 22–32)
Calcium: 9.7 mg/dL (ref 8.9–10.3)
Chloride: 105 mmol/L (ref 98–111)
Creatinine, Ser: 0.76 mg/dL (ref 0.61–1.24)
GFR, Estimated: >60 mL/min (ref >60)
Glucose, Bld: 93 mg/dL (ref 70–99)
Potassium: 4 mmol/L (ref 3.5–5.1)
Sodium: 140 mmol/L (ref 135–145)
Total Bilirubin: 0.8 mg/dL (ref 0.0–1.2)
Total Protein: 7.6 g/dL (ref 6.5–8.1)

## 2023-09-13 LAB — LIPASE, BLOOD: Lipase: 32 U/L (ref 11–51)

## 2023-09-13 LAB — URINALYSIS, ROUTINE W REFLEX MICROSCOPIC
Bilirubin Urine: NEGATIVE
Glucose, UA: NEGATIVE mg/dL
Hgb urine dipstick: NEGATIVE
Ketones, ur: NEGATIVE mg/dL
Leukocytes,Ua: NEGATIVE
Nitrite: NEGATIVE
Protein, ur: NEGATIVE mg/dL
Specific Gravity, Urine: 1.013 (ref 1.005–1.030)
pH: 8 (ref 5.0–8.0)

## 2023-09-13 LAB — CBC
HCT: 48 % (ref 39.0–52.0)
Hemoglobin: 16.1 g/dL (ref 13.0–17.0)
MCH: 29.3 pg (ref 26.0–34.0)
MCHC: 33.5 g/dL (ref 30.0–36.0)
MCV: 87.3 fL (ref 80.0–100.0)
Platelets: 258 10*3/uL (ref 150–400)
RBC: 5.5 MIL/uL (ref 4.22–5.81)
RDW: 12 % (ref 11.5–15.5)
WBC: 5.9 10*3/uL (ref 4.0–10.5)
nRBC: 0 % (ref 0.0–0.2)

## 2023-09-13 LAB — TYPE AND SCREEN
ABO/RH(D): O NEG
Antibody Screen: NEGATIVE

## 2023-09-13 LAB — ETHANOL: Alcohol, Ethyl (B): <15 mg/dL (ref <15)

## 2023-09-13 SURGERY — EGD (ESOPHAGOGASTRODUODENOSCOPY)
Anesthesia: General

## 2023-09-13 MED ORDER — PROPOFOL 10 MG/ML IV BOLUS
INTRAVENOUS | Status: DC | PRN
Start: 2023-09-13 — End: 2023-09-13
  Administered 2023-09-13: 300 mg via INTRAVENOUS

## 2023-09-13 MED ORDER — SODIUM CHLORIDE 0.9% FLUSH: 3.0000 mL | Freq: Two times a day (BID) | INTRAVENOUS | Status: DC

## 2023-09-13 MED ORDER — SODIUM CHLORIDE 0.9 % IV SOLN
INTRAVENOUS | Status: DC | PRN
Start: 2023-09-13 — End: 2023-09-13

## 2023-09-13 MED ORDER — MORPHINE SULFATE (PF) 4 MG/ML IV SOLN
2.0000 mg | Freq: Once | INTRAVENOUS | Status: AC
  Administered 2023-09-13: 2 mg via INTRAVENOUS
  Filled 2023-09-13: qty 1

## 2023-09-13 MED ORDER — FAMOTIDINE IN NACL 20-0.9 MG/50ML-% IV SOLN
20.0000 mg | Freq: Once | INTRAVENOUS | Status: AC
  Administered 2023-09-13: 20 mg via INTRAVENOUS
  Filled 2023-09-13: qty 50

## 2023-09-13 MED ORDER — SODIUM CHLORIDE 0.9 % IV BOLUS
1000.0000 mL | Freq: Once | INTRAVENOUS | Status: AC
  Administered 2023-09-13: 1000 mL via INTRAVENOUS

## 2023-09-13 MED ORDER — FENTANYL CITRATE (PF) 100 MCG/2ML IJ SOLN
INTRAMUSCULAR | Status: AC
Start: 2023-09-13 — End: ?
  Filled 2023-09-13: qty 2

## 2023-09-13 MED ORDER — SODIUM CHLORIDE 0.9% FLUSH: 3.0000 mL | INTRAVENOUS | Status: DC | PRN

## 2023-09-13 MED ORDER — FENTANYL CITRATE (PF) 100 MCG/2ML IJ SOLN
INTRAMUSCULAR | Status: DC | PRN
  Administered 2023-09-13: 50 ug via INTRAVENOUS

## 2023-09-13 MED ORDER — LIDOCAINE 2% (20 MG/ML) 5 ML SYRINGE
INTRAMUSCULAR | Status: DC | PRN
  Administered 2023-09-13: 40 mg via INTRAVENOUS

## 2023-09-13 MED ORDER — DEXAMETHASONE SODIUM PHOSPHATE 4 MG/ML IJ SOLN
INTRAMUSCULAR | Status: DC | PRN
  Administered 2023-09-13: 5 mg via INTRAVENOUS

## 2023-09-13 MED ORDER — ONDANSETRON HCL 4 MG/2ML IJ SOLN
INTRAMUSCULAR | Status: DC | PRN
  Administered 2023-09-13: 4 mg via INTRAVENOUS

## 2023-09-13 MED ORDER — SUCRALFATE 1 GM/10ML PO SUSP
1.0000 g | Freq: Three times a day (TID) | ORAL | Status: DC
  Administered 2023-09-13: 1 g via ORAL
  Filled 2023-09-13: qty 10

## 2023-09-13 MED ORDER — SUCCINYLCHOLINE CHLORIDE 200 MG/10ML IV SOSY
PREFILLED_SYRINGE | INTRAVENOUS | Status: DC | PRN
  Administered 2023-09-13: 200 mg via INTRAVENOUS

## 2023-09-13 NOTE — ED Notes (Signed)
 Endo called for report on patient and said they will be here to get him in a couple hours, patient aware.

## 2023-09-13 NOTE — ED Notes (Signed)
Patient transported to ENDO 

## 2023-09-13 NOTE — ED Provider Notes (Signed)
 [place] EMERGENCY DEPARTMENT AT [facility] Provider Note   CSN: [id number] Arrival date & time: 09/13/23  6578     History  Chief Complaint  Patient presents with   Hematemesis   Sore Throat    Dean Hale is a 18 y.o. male.  HPI Patient is, ongoing management presents with 5 days of worsening sore throat, 1 episode of dark material in his emesis.  There are some chest pain, mostly midline neck pain, towards the epigastrium.  Syncope.  There is no weakness.    Home Medications Prior to Admission medications   Medication Sig Start Date End Date Taking? Authorizing Provider  albuterol  (PROVENTIL  HFA;VENTOLIN  HFA) 108 (90 Base) MCG/ACT inhaler Inhale 1-2 puffs into the lungs every 6 (six) hours as needed for wheezing or shortness of breath. 01/29/18   Reichert, Janyth Meres, MD  amoxicillin -clavulanate (AUGMENTIN ) 875-125 MG tablet Take 1 tablet by mouth every 12 (twelve) hours. 07/15/23   Imogene Mana, NP  cetirizine  (ZYRTEC ) 10 MG tablet Take 10 mg by mouth daily.    [provider]  chlorhexidine  (PERIDEX ) 0.12 % solution Use as directed 15 mLs in the mouth or throat 2 (two) times daily. 07/15/23   Imogene Mana, NP  cyproheptadine (PERIACTIN) 4 MG tablet Take 4 mg by mouth in the morning, at noon, and at bedtime. 09/10/20   [provider]  desvenlafaxine (PRISTIQ) 50 MG 24 hr tablet Take 50 mg by mouth daily. 08/15/20   [provider]  EPINEPHrine  0.3 mg/0.3 mL IJ SOAJ injection Inject 0.3 mg into the muscle as needed for anaphylaxis. 09/16/20   [provider]  lidocaine  (XYLOCAINE ) 2 % solution Use as directed 15 mLs in the mouth or throat as needed for mouth pain. 07/15/23   Imogene Mana, NP  montelukast  (SINGULAIR ) 10 MG tablet Take 1 tablet (10 mg total) by mouth at bedtime. 02/05/22   Laura Polio, MD  Multiple Vitamin (MULTIVITAMIN) tablet Take 1 tablet by mouth daily.    [provider]  ondansetron  (ZOFRAN -ODT) 4 MG  disintegrating tablet Take 1 tablet (4 mg total) by mouth every 8 (eight) hours as needed. 08/08/23   Sueellen Emery, MD  pantoprazole  (PROTONIX ) 40 MG tablet Take 1 tablet (40 mg total) by mouth 2 (two) times daily. 01/12/22 02/11/22  Laura Polio, MD  SYMBICORT 160-4.5 MCG/ACT inhaler Inhale 1 puff into the lungs 2 (two) times daily.    [provider]      Allergies    Peanut-containing drug products, Almond (diagnostic), Cashew nut oil, Shellfish allergy, Fish allergy, and Insect extract    Review of Systems   Review of Systems  Physical Exam Updated Vital Signs BP (!) 113/59   Pulse 74   Temp 98.3 F (36.8 C) (Temporal)   Resp 15   Ht 5\' 4"  (1.626 m)   Wt 66.7 kg   SpO2 95%   BMI 25.23 kg/m  Physical Exam Vitals and nursing note reviewed.  Constitutional:      General: He is not in acute distress.    Appearance: He is well-developed.  HENT:     Head: Normocephalic and atraumatic.     Mouth/Throat:   Eyes:     Conjunctiva/sclera: Conjunctivae normal.  Cardiovascular:     Rate and Rhythm: Normal rate and regular rhythm.  Pulmonary:     Effort: Pulmonary effort is normal. No respiratory distress.     Breath sounds: No stridor.  Abdominal:     General: There is  no distension.  Skin:    General: Skin is warm and dry.  Neurological:     Mental Status: He is alert and oriented to person, place, and time.     ED Results / Procedures / Treatments   Labs (all labs ordered are listed, but only abnormal results are displayed) Labs Reviewed  LIPASE, BLOOD  COMPREHENSIVE METABOLIC PANEL WITH GFR  CBC  URINALYSIS, ROUTINE W REFLEX MICROSCOPIC  ETHANOL  TYPE AND SCREEN  ABO/RH  SURGICAL PATHOLOGY    EKG None  Radiology No results found.  Procedures Procedures    Medications Ordered in ED Medications  sucralfate  (CARAFATE ) 1 GM/10ML suspension 1 g ( Oral MAR Unhold 09/13/23 1548)  sodium chloride  0.9 % bolus 1,000 mL (0 mLs Intravenous Stopped  09/13/23 1056)  famotidine  (PEPCID ) IVPB 20 mg premix (0 mg Intravenous Stopped 09/13/23 1055)  morphine  (PF) 4 MG/ML injection 2 mg (2 mg Intravenous Given 09/13/23 1225)    ED Course/ Medical Decision Making/ A&P                                 Medical Decision Making Patient is, ongoing management presents with 5 days of worsening sore throat, 1 episode of dark material in his emesis.  There are some chest pain, mostly midline neck pain, towards the epigastrium.  Syncope.  There is no weakness.  Identified esophagitis versus gastritis, gastroesophagitis, less likely ACS given youth, and his history. Pulse ox 100% room air normal  Amount and/or Complexity of Data Reviewed External Data Reviewed: notes.    Details: 5 ED visits in the past 6 months Labs: ordered. Decision-making details documented in ED Course.  Risk Prescription drug management. Decision regarding hospitalization.  Update: I discussed the patient's case with his gastroenterologist.  With concern for severe pain in the context of known eosinophilic esophagitis, patient will have endoscopy.   4:32 PM  Patient awake and alert, returned from endoscopy, we reviewed his images he notes that he feels better, no report of complication, labs reviewed, no evidence for concurrent infection, pancreatitis, vital signs remained stable patient will follow-up with GI in the clinic.        Final Clinical Impression(s) / ED Diagnoses Final diagnoses:  Esophagitis    Rx / DC Orders ED Discharge Orders     None         Dorenda Gandy, MD 09/13/23 [phone]

## 2023-09-13 NOTE — Interval H&P Note (Signed)
 History and Physical Interval Note: 18 year old male with history of eosinophilic esophagitis with dysphagia, odynophagia and coffee-ground emesis for EGD.  09/13/2023 2:20 PM  Dean Hale  has presented today for EGD with propofol , with the diagnosis of History of eosinophilic esophagitis, dysphagia, coffee-ground emesis, suspected esophageal food bolus impaction.  The various methods of treatment have been discussed with the patient and family. After consideration of risks, benefits and other options for treatment, the patient has consented to  Procedure(s): EGD (ESOPHAGOGASTRODUODENOSCOPY) (N/A) as a surgical intervention.  The patient's history has been reviewed, patient examined, no change in status, stable for surgery.  I have reviewed the patient's chart and labs.  Questions were answered to the patient's satisfaction.     [name]

## 2023-09-13 NOTE — Anesthesia Procedure Notes (Signed)
 Procedure Name: Intubation Date/Time: 09/13/2023 3:04 PM  Performed by: Rochell Chroman, CRNAPre-anesthesia Checklist: Patient identified, Emergency Drugs available, Suction available and Patient being monitored Patient Re-evaluated:Patient Re-evaluated prior to induction Oxygen Delivery Method: Circle system utilized Preoxygenation: Pre-oxygenation with 100% oxygen Induction Type: IV induction, Rapid sequence and Cricoid Pressure applied Laryngoscope Size: 2 and Miller Grade View: Grade I Tube type: Oral Tube size: 7.5 mm Number of attempts: 1 Airway Equipment and Method: Stylet Placement Confirmation: ETT inserted through vocal cords under direct vision, positive ETCO2 and breath sounds checked- equal and bilateral Secured at: 21 cm Tube secured with: Tape Dental Injury: Teeth and Oropharynx as per pre-operative assessment

## 2023-09-13 NOTE — H&P (View-Only) (Signed)
 [facility] Gastroenterology Consult  Referring Provider: No ref. provider found Primary Care Physician:  Horton, Lovika, NP Primary Gastroenterologist: Dr. Clover Dao GI  Reason for Consultation: Coffee-ground emesis  HPI: Dean Hale is a 18 y.o. male states that despite being on pantoprazole  twice a day and taking Dupixent once a week, on Thursdays, he continues to have painful swallowing as well as difficulty swallowing. For the last 1 day he feels like someone took a hot burning knife and shot down his esophagus. Today morning he had an episode of coffee-ground emesis and was advised to proceed to the ER. He has history of eosinophilic esophagitis (diagnosed at age 82) on pantoprazole  40 mg twice a day and Dupixent weekly injections.  EGD 04/30/2023: Changes of eosinophilic esophagitis, no obvious stricture, rare intraepithelial eosinophils noted on proximal and distal esophagus.  Outside records, [facility]: Upper GI series 12/15/2019: Impaction of barium tablet at GE junction, no esophageal dysmotility or visualized esophageal stricture 01/2020: 52 eosinophils in distal esophagus, 45 eosinophils in proximal esophagus 04/2020: 24 eosinophils in distal esophagus, none in proximal esophagus 04/2021: 21 eosinophils in distal esophagus, 5 eosinophils in proximal esophagus April 2023: 56 eosinophils in distal esophagus and 44 eosinophils in proximal esophagus biopsies, 01/04/2022: Distal esophagus, rare intraepithelial eosinophils 4 per high-power field Proximal esophagus, rare intraepithelial eosinophils 5 per high-power field Stomach biopsies, negative for H. pylori Duodenal biopsies, mild chronic, focally active duodenitis suggestive of peptic injury  Past Medical History:  Diagnosis Date   Anxiety    Asthma    Chlamydia    Eosinophilic esophagitis 12/2019    Past Surgical History:  Procedure Laterality Date   MYRINGOTOMY WITH TUBE PLACEMENT     WISDOM TOOTH EXTRACTION  Bilateral 07/09/2023    Prior to Admission medications   Medication Sig Start Date End Date Taking? Authorizing Provider  albuterol  (PROVENTIL  HFA;VENTOLIN  HFA) 108 (90 Base) MCG/ACT inhaler Inhale 1-2 puffs into the lungs every 6 (six) hours as needed for wheezing or shortness of breath. 01/29/18   Reichert, Janyth Meres, MD  amoxicillin -clavulanate (AUGMENTIN ) 875-125 MG tablet Take 1 tablet by mouth every 12 (twelve) hours. 07/15/23   Imogene Mana, NP  cetirizine  (ZYRTEC ) 10 MG tablet Take 10 mg by mouth daily.    [provider]  chlorhexidine  (PERIDEX ) 0.12 % solution Use as directed 15 mLs in the mouth or throat 2 (two) times daily. 07/15/23   Imogene Mana, NP  cyproheptadine (PERIACTIN) 4 MG tablet Take 4 mg by mouth in the morning, at noon, and at bedtime. 09/10/20   [provider]  desvenlafaxine (PRISTIQ) 50 MG 24 hr tablet Take 50 mg by mouth daily. 08/15/20   [provider]  EPINEPHrine  0.3 mg/0.3 mL IJ SOAJ injection Inject 0.3 mg into the muscle as needed for anaphylaxis. 09/16/20   [provider]  lidocaine  (XYLOCAINE ) 2 % solution Use as directed 15 mLs in the mouth or throat as needed for mouth pain. 07/15/23   Imogene Mana, NP  montelukast  (SINGULAIR ) 10 MG tablet Take 1 tablet (10 mg total) by mouth at bedtime. 02/05/22   Laura Polio, MD  Multiple Vitamin (MULTIVITAMIN) tablet Take 1 tablet by mouth daily.    [provider]  ondansetron  (ZOFRAN -ODT) 4 MG disintegrating tablet Take 1 tablet (4 mg total) by mouth every 8 (eight) hours as needed. 08/08/23   Sueellen Emery, MD  pantoprazole  (PROTONIX ) 40 MG tablet Take 1 tablet (40 mg total) by mouth 2 (two) times daily. 01/12/22 02/11/22  Laura Polio,  MD  SYMBICORT 160-4.5 MCG/ACT inhaler Inhale 1 puff into the lungs 2 (two) times daily.    [provider]    Current Facility-Administered Medications  Medication Dose Route Frequency Provider Last Rate Last Admin   sucralfate  (CARAFATE ) 1  GM/10ML suspension 1 g  1 g Oral TID WC & HS Dorenda Gandy, MD       Current Outpatient Medications  Medication Sig Dispense Refill   albuterol  (PROVENTIL  HFA;VENTOLIN  HFA) 108 (90 Base) MCG/ACT inhaler Inhale 1-2 puffs into the lungs every 6 (six) hours as needed for wheezing or shortness of breath. 1 Inhaler 0   amoxicillin -clavulanate (AUGMENTIN ) 875-125 MG tablet Take 1 tablet by mouth every 12 (twelve) hours. 14 tablet 0   cetirizine  (ZYRTEC ) 10 MG tablet Take 10 mg by mouth daily.     chlorhexidine  (PERIDEX ) 0.12 % solution Use as directed 15 mLs in the mouth or throat 2 (two) times daily. 120 mL 0   cyproheptadine (PERIACTIN) 4 MG tablet Take 4 mg by mouth in the morning, at noon, and at bedtime.     desvenlafaxine (PRISTIQ) 50 MG 24 hr tablet Take 50 mg by mouth daily.     EPINEPHrine  0.3 mg/0.3 mL IJ SOAJ injection Inject 0.3 mg into the muscle as needed for anaphylaxis.     lidocaine  (XYLOCAINE ) 2 % solution Use as directed 15 mLs in the mouth or throat as needed for mouth pain. 15 mL 0   montelukast  (SINGULAIR ) 10 MG tablet Take 1 tablet (10 mg total) by mouth at bedtime. 30 tablet 3   Multiple Vitamin (MULTIVITAMIN) tablet Take 1 tablet by mouth daily.     ondansetron  (ZOFRAN -ODT) 4 MG disintegrating tablet Take 1 tablet (4 mg total) by mouth every 8 (eight) hours as needed. 20 tablet 0   pantoprazole  (PROTONIX ) 40 MG tablet Take 1 tablet (40 mg total) by mouth 2 (two) times daily. 60 tablet 0   SYMBICORT 160-4.5 MCG/ACT inhaler Inhale 1 puff into the lungs 2 (two) times daily.      Allergies as of 09/13/2023 - Review Complete 09/13/2023  Allergen Reaction Noted   Peanut-containing drug products Hives 05/30/2012   Almond (diagnostic)  05/26/2016   Cashew nut oil  05/26/2016   Shellfish allergy  05/30/2012   Fish allergy  09/18/2020   Insect extract  09/18/2020    No family history on file.  Social History   Socioeconomic History   Marital status: Single    Spouse  name: Not on file   Number of children: Not on file   Years of education: Not on file   Highest education level: Not on file  Occupational History   Not on file  Tobacco Use   Smoking status: Never    Passive exposure: Yes   Smokeless tobacco: Never  Vaping Use   Vaping status: Never Used  Substance and Sexual Activity   Alcohol use: Never   Drug use: Never   Sexual activity: Yes  Other Topics Concern   Not on file  Social History Narrative   In 10th grade at [employer]    Social Drivers of Corporate investment banker Strain: Not on file  Food Insecurity: Not on file  Transportation Needs: Not on file  Physical Activity: Not on file  Stress: Not on file  Social Connections: Unknown (09/26/2021)   Received from [facility]   Social Network    Social Network: Not on file  Intimate Partner Violence: Unknown (08/18/2021)  Received from [facility], [facility]   HITS    Physically Hurt: Not on file    Insult or Talk Down To: Not on file    Threaten Physical Harm: Not on file    Scream or Curse: Not on file    Review of Systems: As per HPI Physical Exam: Vital signs in last 24 hours: Temp:  [97.9 F (36.6 C)-98 F (36.7 C)] 97.9 F (36.6 C) (05/01 0959) Pulse Rate:  [73-75] 75 (05/01 0959) Resp:  [17-18] 17 (05/01 0959) BP: (136-141)/(79-83) 136/79 (05/01 0959) SpO2:  [97 %-99 %] 97 % (05/01 0959) Weight:  [65.8 kg-66.7 kg] 66.7 kg (05/01 0959)    General:   Alert,  Well-developed, well-nourished, pleasant and cooperative in NAD Head:  Normocephalic and atraumatic. Eyes:  Sclera clear, no icterus.   Conjunctiva pink. Ears:  Normal auditory acuity. Nose:  No deformity, discharge,  or lesions. Mouth:  No deformity or lesions.  Oropharynx pink & moist. Neck:  Supple; no masses or thyromegaly. Lungs:  Clear throughout to auscultation.   No wheezes, crackles, or rhonchi. No acute distress. Heart:  Regular rate and rhythm; no murmurs,  clicks, rubs,  or gallops. Extremities:  Without clubbing or edema. Neurologic:  Alert and  oriented x4;  grossly normal neurologically. Skin:  Intact without significant lesions or rashes. Psych:  Alert and cooperative. Normal mood and affect. Abdomen:  Soft, nontender and nondistended. No masses, hepatosplenomegaly or hernias noted. Normal bowel sounds, without guarding, and without rebound.         Lab Results: Recent Labs    09/13/23 0925  WBC 5.9  HGB 16.1  HCT 48.0  PLT 258   BMET Recent Labs    09/13/23 0925  NA 140  K 4.0  CL 105  CO2 25  GLUCOSE 93  BUN 9  CREATININE 0.76  CALCIUM 9.7   LFT Recent Labs    09/13/23 0925  PROT 7.6  ALBUMIN 4.5  AST 30  ALT 32  ALKPHOS 58  BILITOT 0.8   PT/INR No results for input(s): "LABPROT", "INR" in the last 72 hours.  Studies/Results: No results found.  Impression: Coffee-ground emesis Dysphagia Odynophagia History of eosinophilic esophagitis  Plan: Diagnostic EGD now. Possible balloon dilation if needed.  He was given famotidine  20 mg IV x 1 in ER. Last dose of pantoprazole  was yesterday evening. Last Dupixent use was last Thursday.   LOS: 0 days   [name], MD  09/13/2023, 11:52 AM

## 2023-09-13 NOTE — Discharge Instructions (Addendum)

## 2023-09-13 NOTE — Consult Note (Signed)
 [facility] Gastroenterology Consult  Referring Provider: No ref. provider found Primary Care Physician:  Horton, Lovika, NP Primary Gastroenterologist: Dr. Clover Dao GI  Reason for Consultation: Coffee-ground emesis  HPI: Dean Hale is a 18 y.o. male states that despite being on pantoprazole  twice a day and taking Dupixent once a week, on Thursdays, he continues to have painful swallowing as well as difficulty swallowing. For the last 1 day he feels like someone took a hot burning knife and shot down his esophagus. Today morning he had an episode of coffee-ground emesis and was advised to proceed to the ER. He has history of eosinophilic esophagitis (diagnosed at age 82) on pantoprazole  40 mg twice a day and Dupixent weekly injections.  EGD 04/30/2023: Changes of eosinophilic esophagitis, no obvious stricture, rare intraepithelial eosinophils noted on proximal and distal esophagus.  Outside records, [facility]: Upper GI series 12/15/2019: Impaction of barium tablet at GE junction, no esophageal dysmotility or visualized esophageal stricture 01/2020: 52 eosinophils in distal esophagus, 45 eosinophils in proximal esophagus 04/2020: 24 eosinophils in distal esophagus, none in proximal esophagus 04/2021: 21 eosinophils in distal esophagus, 5 eosinophils in proximal esophagus April 2023: 56 eosinophils in distal esophagus and 44 eosinophils in proximal esophagus biopsies, 01/04/2022: Distal esophagus, rare intraepithelial eosinophils 4 per high-power field Proximal esophagus, rare intraepithelial eosinophils 5 per high-power field Stomach biopsies, negative for H. pylori Duodenal biopsies, mild chronic, focally active duodenitis suggestive of peptic injury  Past Medical History:  Diagnosis Date   Anxiety    Asthma    Chlamydia    Eosinophilic esophagitis 12/2019    Past Surgical History:  Procedure Laterality Date   MYRINGOTOMY WITH TUBE PLACEMENT     WISDOM TOOTH EXTRACTION  Bilateral 07/09/2023    Prior to Admission medications   Medication Sig Start Date End Date Taking? Authorizing Provider  albuterol  (PROVENTIL  HFA;VENTOLIN  HFA) 108 (90 Base) MCG/ACT inhaler Inhale 1-2 puffs into the lungs every 6 (six) hours as needed for wheezing or shortness of breath. 01/29/18   Reichert, Janyth Meres, MD  amoxicillin -clavulanate (AUGMENTIN ) 875-125 MG tablet Take 1 tablet by mouth every 12 (twelve) hours. 07/15/23   Imogene Mana, NP  cetirizine  (ZYRTEC ) 10 MG tablet Take 10 mg by mouth daily.    [provider]  chlorhexidine  (PERIDEX ) 0.12 % solution Use as directed 15 mLs in the mouth or throat 2 (two) times daily. 07/15/23   Imogene Mana, NP  cyproheptadine (PERIACTIN) 4 MG tablet Take 4 mg by mouth in the morning, at noon, and at bedtime. 09/10/20   [provider]  desvenlafaxine (PRISTIQ) 50 MG 24 hr tablet Take 50 mg by mouth daily. 08/15/20   [provider]  EPINEPHrine  0.3 mg/0.3 mL IJ SOAJ injection Inject 0.3 mg into the muscle as needed for anaphylaxis. 09/16/20   [provider]  lidocaine  (XYLOCAINE ) 2 % solution Use as directed 15 mLs in the mouth or throat as needed for mouth pain. 07/15/23   Imogene Mana, NP  montelukast  (SINGULAIR ) 10 MG tablet Take 1 tablet (10 mg total) by mouth at bedtime. 02/05/22   Laura Polio, MD  Multiple Vitamin (MULTIVITAMIN) tablet Take 1 tablet by mouth daily.    [provider]  ondansetron  (ZOFRAN -ODT) 4 MG disintegrating tablet Take 1 tablet (4 mg total) by mouth every 8 (eight) hours as needed. 08/08/23   Sueellen Emery, MD  pantoprazole  (PROTONIX ) 40 MG tablet Take 1 tablet (40 mg total) by mouth 2 (two) times daily. 01/12/22 02/11/22  Laura Polio,  MD  SYMBICORT 160-4.5 MCG/ACT inhaler Inhale 1 puff into the lungs 2 (two) times daily.    [provider]    Current Facility-Administered Medications  Medication Dose Route Frequency Provider Last Rate Last Admin   sucralfate  (CARAFATE ) 1  GM/10ML suspension 1 g  1 g Oral TID WC & HS Dorenda Gandy, MD       Current Outpatient Medications  Medication Sig Dispense Refill   albuterol  (PROVENTIL  HFA;VENTOLIN  HFA) 108 (90 Base) MCG/ACT inhaler Inhale 1-2 puffs into the lungs every 6 (six) hours as needed for wheezing or shortness of breath. 1 Inhaler 0   amoxicillin -clavulanate (AUGMENTIN ) 875-125 MG tablet Take 1 tablet by mouth every 12 (twelve) hours. 14 tablet 0   cetirizine  (ZYRTEC ) 10 MG tablet Take 10 mg by mouth daily.     chlorhexidine  (PERIDEX ) 0.12 % solution Use as directed 15 mLs in the mouth or throat 2 (two) times daily. 120 mL 0   cyproheptadine (PERIACTIN) 4 MG tablet Take 4 mg by mouth in the morning, at noon, and at bedtime.     desvenlafaxine (PRISTIQ) 50 MG 24 hr tablet Take 50 mg by mouth daily.     EPINEPHrine  0.3 mg/0.3 mL IJ SOAJ injection Inject 0.3 mg into the muscle as needed for anaphylaxis.     lidocaine  (XYLOCAINE ) 2 % solution Use as directed 15 mLs in the mouth or throat as needed for mouth pain. 15 mL 0   montelukast  (SINGULAIR ) 10 MG tablet Take 1 tablet (10 mg total) by mouth at bedtime. 30 tablet 3   Multiple Vitamin (MULTIVITAMIN) tablet Take 1 tablet by mouth daily.     ondansetron  (ZOFRAN -ODT) 4 MG disintegrating tablet Take 1 tablet (4 mg total) by mouth every 8 (eight) hours as needed. 20 tablet 0   pantoprazole  (PROTONIX ) 40 MG tablet Take 1 tablet (40 mg total) by mouth 2 (two) times daily. 60 tablet 0   SYMBICORT 160-4.5 MCG/ACT inhaler Inhale 1 puff into the lungs 2 (two) times daily.      Allergies as of 09/13/2023 - Review Complete 09/13/2023  Allergen Reaction Noted   Peanut-containing drug products Hives 05/30/2012   Almond (diagnostic)  05/26/2016   Cashew nut oil  05/26/2016   Shellfish allergy  05/30/2012   Fish allergy  09/18/2020   Insect extract  09/18/2020    No family history on file.  Social History   Socioeconomic History   Marital status: Single    Spouse  name: Not on file   Number of children: Not on file   Years of education: Not on file   Highest education level: Not on file  Occupational History   Not on file  Tobacco Use   Smoking status: Never    Passive exposure: Yes   Smokeless tobacco: Never  Vaping Use   Vaping status: Never Used  Substance and Sexual Activity   Alcohol use: Never   Drug use: Never   Sexual activity: Yes  Other Topics Concern   Not on file  Social History Narrative   In 10th grade at [employer]    Social Drivers of Corporate investment banker Strain: Not on file  Food Insecurity: Not on file  Transportation Needs: Not on file  Physical Activity: Not on file  Stress: Not on file  Social Connections: Unknown (09/26/2021)   Received from [facility]   Social Network    Social Network: Not on file  Intimate Partner Violence: Unknown (08/18/2021)  Received from [facility], [facility]   HITS    Physically Hurt: Not on file    Insult or Talk Down To: Not on file    Threaten Physical Harm: Not on file    Scream or Curse: Not on file    Review of Systems: As per HPI Physical Exam: Vital signs in last 24 hours: Temp:  [97.9 F (36.6 C)-98 F (36.7 C)] 97.9 F (36.6 C) (05/01 0959) Pulse Rate:  [73-75] 75 (05/01 0959) Resp:  [17-18] 17 (05/01 0959) BP: (136-141)/(79-83) 136/79 (05/01 0959) SpO2:  [97 %-99 %] 97 % (05/01 0959) Weight:  [65.8 kg-66.7 kg] 66.7 kg (05/01 0959)    General:   Alert,  Well-developed, well-nourished, pleasant and cooperative in NAD Head:  Normocephalic and atraumatic. Eyes:  Sclera clear, no icterus.   Conjunctiva pink. Ears:  Normal auditory acuity. Nose:  No deformity, discharge,  or lesions. Mouth:  No deformity or lesions.  Oropharynx pink & moist. Neck:  Supple; no masses or thyromegaly. Lungs:  Clear throughout to auscultation.   No wheezes, crackles, or rhonchi. No acute distress. Heart:  Regular rate and rhythm; no murmurs,  clicks, rubs,  or gallops. Extremities:  Without clubbing or edema. Neurologic:  Alert and  oriented x4;  grossly normal neurologically. Skin:  Intact without significant lesions or rashes. Psych:  Alert and cooperative. Normal mood and affect. Abdomen:  Soft, nontender and nondistended. No masses, hepatosplenomegaly or hernias noted. Normal bowel sounds, without guarding, and without rebound.         Lab Results: Recent Labs    09/13/23 0925  WBC 5.9  HGB 16.1  HCT 48.0  PLT 258   BMET Recent Labs    09/13/23 0925  NA 140  K 4.0  CL 105  CO2 25  GLUCOSE 93  BUN 9  CREATININE 0.76  CALCIUM 9.7   LFT Recent Labs    09/13/23 0925  PROT 7.6  ALBUMIN 4.5  AST 30  ALT 32  ALKPHOS 58  BILITOT 0.8   PT/INR No results for input(s): "LABPROT", "INR" in the last 72 hours.  Studies/Results: No results found.  Impression: Coffee-ground emesis Dysphagia Odynophagia History of eosinophilic esophagitis  Plan: Diagnostic EGD now. Possible balloon dilation if needed.  He was given famotidine  20 mg IV x 1 in ER. Last dose of pantoprazole  was yesterday evening. Last Dupixent use was last Thursday.   LOS: 0 days   [name], MD  09/13/2023, 11:52 AM

## 2023-09-13 NOTE — Anesthesia Preprocedure Evaluation (Addendum)
 Anesthesia Evaluation  Patient identified by MRN, date of birth, ID band Patient awake    Reviewed: Allergy & Precautions, NPO status , Patient's Chart, lab work & pertinent test results  History of Anesthesia Complications Negative for: history of anesthetic complications  Airway Mallampati: II  TM Distance: >3 FB Neck ROM: Full    Dental  (+) Dental Advisory Given   Pulmonary COPD,  COPD inhaler   breath sounds clear to auscultation       Cardiovascular negative cardio ROS  Rhythm:Regular Rate:Normal     Neuro/Psych   Anxiety     negative neurological ROS     GI/Hepatic Neg liver ROS,,,Eosinophilic esophagitis, Difficulty swallowing Plan: Diagnostic EGD now. Possible balloon dilation if needed.   He was given famotidine  20 mg IV x 1 in ER. Last dose of pantoprazole  was yesterday evening.    Endo/Other  negative endocrine ROS    Renal/GU negative Renal ROS     Musculoskeletal   Abdominal   Peds  Hematology negative hematology ROS (+)   Anesthesia Other Findings   Reproductive/Obstetrics                             Anesthesia Physical Anesthesia Plan  ASA: 2  Anesthesia Plan: General   Post-op Pain Management: Minimal or no pain anticipated   Induction:   PONV Risk Score and Plan: 2 and Ondansetron  and Dexamethasone   Airway Management Planned: Oral ETT  Additional Equipment: None  Intra-op Plan:   Post-operative Plan: Extubation in OR  Informed Consent: I have reviewed the patients History and Physical, chart, labs and discussed the procedure including the risks, benefits and alternatives for the proposed anesthesia with the patient or authorized representative who has indicated his/her understanding and acceptance.     Dental advisory given  Plan Discussed with: CRNA and Surgeon  Anesthesia Plan Comments:         Anesthesia Quick Evaluation

## 2023-09-13 NOTE — Transfer of Care (Signed)
 Immediate Anesthesia Transfer of Care Note  Patient: Dean Hale  Procedure(s) Performed: EGD (ESOPHAGOGASTRODUODENOSCOPY)  Patient Location: Endoscopy Unit  Anesthesia Type:General  Level of Consciousness: awake and patient cooperative  Airway & Oxygen Therapy: Patient Spontanous Breathing and Patient connected to face mask  Post-op Assessment: Report given to RN and Post -op Vital signs reviewed and stable  Post vital signs: Reviewed and stable  Last Vitals:  Vitals Value Taken Time  BP    Temp    Pulse    Resp    SpO2      Last Pain:  Vitals:   09/13/23 1417  TempSrc: Temporal  PainSc: 10-Worst pain ever         Complications: No notable events documented.

## 2023-09-13 NOTE — Addendum Note (Signed)
 Addendum  created 09/13/23 1544 by Jonne Netters, MD   Clinical Note Signed

## 2023-09-13 NOTE — ED Notes (Signed)
 Pt states throat is feeling little tighter, pain from the throat down to the belly button 10/10, Provider notified

## 2023-09-13 NOTE — Anesthesia Postprocedure Evaluation (Addendum)
 Anesthesia Post Note  Patient: Dean Hale  Procedure(s) Performed: EGD (ESOPHAGOGASTRODUODENOSCOPY)     Patient location during evaluation: Endoscopy Anesthesia Type: General Level of consciousness: patient cooperative, oriented and sedated Pain management: pain level controlled Vital Signs Assessment: post-procedure vital signs reviewed and stable Respiratory status: spontaneous breathing, nonlabored ventilation and respiratory function stable Cardiovascular status: blood pressure returned to baseline and stable Postop Assessment: no apparent nausea or vomiting Anesthetic complications: no   No notable events documented.  Last Vitals:  Vitals:   09/13/23 1530 09/13/23 1540  BP: 117/62 (!) 113/59  Pulse: 74 74  Resp: 15 15  Temp:    SpO2: 96% 95%    Last Pain:  Vitals:   09/13/23 1540  TempSrc:   PainSc: 3                  Wally Behan,E. Pauleen Goleman

## 2023-09-13 NOTE — ED Triage Notes (Signed)
 Pt c/o painful throat for 5x days- history of EOE. Had one episode of coffee ground emesis this am.  AOx4

## 2023-09-13 NOTE — ED Notes (Signed)
 Patient is reporting 9/10 pain in throat and abdomen. Patient states it feels like a stabbing pain in abdomen and burning sensation in throat.

## 2023-09-13 NOTE — Op Note (Signed)
 [facility] Patient Name: Dean Hale Procedure Date: 09/13/2023 MRN: [id number] Attending MD: [name] , MD, [id number] Date of Birth: Oct 06, 2005 CSN: [id number] Age: 18 Admit Type: Inpatient Procedure:                Upper GI endoscopy Indications:              Dysphagia, Coffee-ground emesis, History of                            eosinophilic esophagitis Providers:                [name], MD, Lorenzo Romberg, RN, Alfredia Ina, Technician Referring MD:             ER Medicines:                Monitored Anesthesia Care Complications:            No immediate complications. Estimated blood loss:                            Minimal. Estimated Blood Loss:     Estimated blood loss was minimal. Procedure:                Pre-Anesthesia Assessment:                           - Prior to the procedure, a History and Physical                            was performed, and patient medications and                            allergies were reviewed. The patient's tolerance of                            previous anesthesia was also reviewed. The risks                            and benefits of the procedure and the sedation                            options and risks were discussed with the patient.                            All questions were answered, and informed consent                            was obtained. Prior Anticoagulants: The patient has                            taken no anticoagulant or antiplatelet agents. ASA                            Grade Assessment: II - A  patient with mild systemic                            disease. After reviewing the risks and benefits,                            the patient was deemed in satisfactory condition to                            undergo the procedure.                           After obtaining informed consent, the endoscope was                            passed under direct vision. Throughout  the                            procedure, the patient's blood pressure, pulse, and                            oxygen saturations were monitored continuously. The                            GIF-H190 ([id number]) Olympus endoscope was introduced                            through the mouth, and advanced to the second part                            of duodenum. The upper GI endoscopy was                            accomplished without difficulty. The patient                            tolerated the procedure well. Scope In: Scope Out: Findings:      Mucosal changes including longitudinal furrows and congestion (edema)       were found in the esophagus. Esophageal findings were graded using the       Eosinophilic Esophagitis Endoscopic Reference Score (EoE-EREFS) as:      Edema Grade 1 Present (decreased clarity or absence of vascular       markings),      Rings Grade 1 Mild (subtle circumferential ridges seen on esophageal       distension),      Exudates Grade 0 None (no white lesions seen),      Furrows Grade 1 Mild (vertical lines without visible depth) and      Stricture none (no stricture found).      Biopsies were obtained from the proximal and distal esophagus with cold       forceps for histology of suspected eosinophilic esophagitis.      The entire examined stomach was normal. Biopsies were taken with a cold       forceps for Helicobacter pylori testing.      The cardia and gastric fundus  were normal on retroflexion.      The examined duodenum was normal. Impression:               - Esophageal mucosal changes consistent with                            eosinophilic esophagitis.                           - Normal stomach. Biopsied.                           - Normal examined duodenum.                           - Biopsies were taken with a cold forceps for                            evaluation of eosinophilic esophagitis. Moderate Sedation:      Patient did not receive moderate  sedation for this procedure, but       instead received monitored anesthesia care. Recommendation:           - Patient has a contact number available for                            emergencies. The signs and symptoms of potential                            delayed complications were discussed with the                            patient. Return to normal activities tomorrow.                            Written discharge instructions were provided to the                            patient.                           - Resume regular diet.                           - Continue present medications.                           - Await pathology results. Procedure Code(s):        --- Professional ---                           [phone], Esophagogastroduodenoscopy, flexible,                            transoral; with biopsy, single or multiple Diagnosis Code(s):        --- Professional ---  K22.89, Other specified disease of esophagus                           R13.10, Dysphagia, unspecified                           K92.0, Hematemesis CPT copyright 2022 American Medical Association. All rights reserved. The codes documented in this report are preliminary and upon coder review may  be revised to meet current compliance requirements. [name], MD 09/13/2023 3:17:07 PM This report has been signed electronically. Number of Addenda: 0

## 2023-09-16 ENCOUNTER — Encounter (HOSPITAL_COMMUNITY): Payer: Self-pay | Admitting: Gastroenterology

## 2023-09-17 LAB — SURGICAL PATHOLOGY

## 2023-09-18 DIAGNOSIS — K200 Eosinophilic esophagitis: Principal | ICD-10-CM

## 2023-09-18 NOTE — Unmapped (Signed)
 [facility] Delivery Pharm[facility] Refill Coordination Note    Specialty Medication(s) to be Shipped:   Inflammatory Disorders: Dupixent    Other medication(s) to be shipped: No additional medications requested for fill at this time     Robert Allison, DOB: 2005/10/22  Phone: [phone] (home)       All above HIPAA information was verified with patient's family member, dad.     Was a Nurse, learning disability used for this call? No    Completed refill call assessment today to schedule patient's medication shipment from the [facility] and [facility] Delivery Pharm[facility]  [phone]).  All relevant notes have been reviewed.     Specialty medication(s) and dose(s) confirmed: Regimen is correct and unchanged.   Changes to medications: Chadric reports no changes at this time.  Changes to insurance: No  New side effects reported not previously addressed with a pharmacist or physician: None reported  Questions for the pharmacist: No    Confirmed patient received a Conservation officer, historic buildings and a Surveyor, mining with first shipment. The patient will receive a drug information handout for each medication shipped and additional FDA Medication Guides as required.       DISEASE/MEDICATION-SPECIFIC INFORMATION        For patients on injectable medications: Patient currently has 1 doses left.  Next injection is scheduled for 05/08.    SPECIALTY MEDICATION ADHERENCE     Medication Adherence    Patient reported X missed doses in the last month: 0  Specialty Medication: Dupixent 300mg /17ml  Patient is on additional specialty medications: No  Patient is on more than two specialty medications: No  Any gaps in refill history greater than 2 weeks in the last 3 months: no  Demonstrates understanding of importance of adherence: yes  Informant: father  Reliability of informant: reliable  Provider-estimated medication adherence level: good  Patient is at risk for Non-Adherence: No  Reasons for non-adherence: no problems identified  Confirmed plan for next specialty medication refill: delivery by pharmacy  Refills needed for supportive medications: yes, ordered or provider notified          Refill Coordination    Has the Patients' Contact Information Changed: No  Is the Shipping Address Different: No         Were doses missed due to medication being on hold? No    Dupixent  300/2 mg/ml: 7 days of medicine on hand       REFERRAL TO PHARMACIST     Referral to the pharmacist: Not needed      [facility]     Shipping address confirmed in Epic.     Cost and Payment: Patient has a $0 copay, payment information is not required.    Delivery Scheduled: Yes, Expected medication delivery date: 05/13.     Medication will be delivered via UPS to the prescription address in Epic WAM.    [facility]  Specialty Technician

## 2023-09-24 NOTE — Unmapped (Signed)
 Robert Allison 's DUPIXENT PEN 300 mg/2 mL pen injector (dupilumab) shipment will be canceled as a result of prior authorization being required by the patient's insurance.     I have reached out to the patient  at [phone] and communicated the delay. We will not reschedule the medication and have removed this/these medication(s) from the work request.  We have canceled this work request.

## 2023-10-12 NOTE — Unmapped (Signed)
 [facility] Delivery Pharm[facility] Refill Coordination Note    Specialty Medication(s) to be Shipped:   Inflammatory Disorders: Dupixent    Other medication(s) to be shipped: No additional medications requested for fill at this time     Robert Allison, DOB: 11/24/2005  Phone: [phone] (home)       All above HIPAA information was verified with patient's family member, Father.     Was a Nurse, learning disability used for this call? No    Completed refill call assessment today to schedule patient's medication shipment from the [facility] Delivery Pharmacy  [phone]).  All relevant notes have been reviewed.     Specialty medication(s) and dose(s) confirmed: Regimen is correct and unchanged.   Changes to medications: [name] reports no changes at this time.  Changes to insurance: No  New side effects reported not previously addressed with a pharmacist or physician: None reported  Questions for the pharmacist: No    Confirmed patient received a Conservation officer, historic buildings and a Surveyor, mining with first shipment. The patient will receive a drug information handout for each medication shipped and additional FDA Medication Guides as required.       DISEASE/MEDICATION-SPECIFIC INFORMATION        For patients on injectable medications: Patient currently has 0 doses left.  Next injection is scheduled for overdue will take on 6/3.    SPECIALTY MEDICATION ADHERENCE     Medication Adherence    Patient reported X missed doses in the last month: 0  Specialty Medication: DUPIXENT PEN 300 mg/2 mL  Patient is on additional specialty medications: No              Were doses missed due to medication being on hold? No    Dupixent 300/2 mg/ml: 0 doses of medicine on hand        REFERRAL TO PHARMACIST     Referral to the pharmacist: Not needed      [facility]     Shipping address confirmed in Epic.     Cost and Payment: Patient has a $0 copay, payment information is not required.    Delivery Scheduled: Yes, Expected medication delivery date: 10/16/23.     Medication will be delivered via UPS to the prescription address in Epic WAM.    Canary Ceo   [facility] Delivery [facility]  Specialty Technician

## 2023-10-15 MED FILL — DUPIXENT 300 MG/2 ML SUBCUTANEOUS PEN INJECTOR: SUBCUTANEOUS | 28 days supply | Qty: 8 | Fill #3

## 2023-11-14 NOTE — Unmapped (Addendum)
 This pharmacist was notified by a technician that this patient has reported they are experiencing the following side effects dizziness and headaches.. I have reached out to the patient and had to leave a voicemail. I documented the reason for the call in WAM in the event the patient calls back.      Approximate time spent: 0-5 minutes    [name], PharmD, Clinical Specialty Pharmacist  [facility]      [facility] and [facility] Delivery Pharm[facility] Refill Coordination Note    Specialty Medication(s) to be Shipped:   Inflammatory Disorders: Dupixent     Other medication(s) to be shipped: No additional medications requested for fill at this time     Robert Allison, DOB: August 21, 2005  Phone: [phone] (home) [phone] (work)      All above HIPAA information was verified with patient.     Was a Nurse, learning disability used for this call? No    Completed refill call assessment today to schedule patient's medication shipment from the [facility] and Home Delivery Pharmacy  [phone]).  All relevant notes have been reviewed.     Specialty medication(s) and dose(s) confirmed: Regimen is correct and unchanged.   Changes to medications: Liberty reports no changes at this time.  Changes to insurance: No  New side effects reported not previously addressed with a pharmacist or physician: None reported  Questions for the pharmacist: No    Confirmed patient received a Conservation officer, historic buildings and a Surveyor, mining with first shipment. The patient will receive a drug information handout for each medication shipped and additional FDA Medication Guides as required.       DISEASE/MEDICATION-SPECIFIC INFORMATION        For patients on injectable medications: Patient currently has 2 doses left.  Next injection is scheduled for 7/3.    SPECIALTY MEDICATION ADHERENCE     Medication Adherence    Patient reported X missed doses in the last month: 0  Specialty Medication: DUPIXENT  PEN 300 mg/2 mL  Patient is on additional specialty medications: No              Were doses missed due to medication being on hold? No    Dupixent  300/2 mg/ml: 2 doses of medicine on hand        REFERRAL TO PHARMACIST     Referral to the pharmacist: Yes - new side effects are reported: Dizziness & Headaches. Refills were scheduled and concern routed (high priority) to pharmacist for evaluation.      SHIPPING     Shipping address confirmed in Epic.     Cost and Payment: Patient has a $0 copay, payment information is not required.    Delivery Scheduled: Yes, Expected medication delivery date: 11/27/23.     Medication will be delivered via UPS to the prescription address in Epic WAM.    Kelly CHRISTELLA Eagles   [facility] and [facility] Delivery Pharm[facility]  Specialty Technician

## 2023-11-18 ENCOUNTER — Encounter (INDEPENDENT_AMBULATORY_CARE_PROVIDER_SITE_OTHER): Payer: Self-pay

## 2023-11-26 MED FILL — DUPIXENT 300 MG/2 ML SUBCUTANEOUS PEN INJECTOR: SUBCUTANEOUS | 28 days supply | Qty: 8 | Fill #4

## 2023-11-28 ENCOUNTER — Other Ambulatory Visit: Payer: Self-pay

## 2023-11-28 ENCOUNTER — Emergency Department (HOSPITAL_COMMUNITY)
Admission: EM | Admit: 2023-11-28 | Discharge: 2023-11-28 | Disposition: A | Discharge status: Home / Self Care | Payer: Worker's Compensation | Attending: Emergency Medicine | Admitting: Emergency Medicine

## 2023-11-28 ENCOUNTER — Encounter (HOSPITAL_COMMUNITY): Payer: Self-pay

## 2023-11-28 ENCOUNTER — Emergency Department (HOSPITAL_COMMUNITY): Payer: Worker's Compensation

## 2023-11-28 DIAGNOSIS — H9193 Unspecified hearing loss, bilateral: Secondary | ICD-10-CM | POA: Insufficient documentation

## 2023-11-28 DIAGNOSIS — H9313 Tinnitus, bilateral: Secondary | ICD-10-CM | POA: Diagnosis present

## 2023-11-28 DIAGNOSIS — Z91010 Allergy to peanuts: Secondary | ICD-10-CM | POA: Insufficient documentation

## 2023-11-28 MED ORDER — CIPROFLOXACIN-DEXAMETHASONE 0.3-0.1 % OT SUSP: 4.0000 [drp] | Freq: Two times a day (BID) | OTIC | 0 refills | Status: AC

## 2023-11-28 NOTE — ED Provider Triage Note (Signed)
 Emergency Medicine Provider Triage Evaluation Note  Dean Hale , a 18 y.o. male  was evaluated in triage.  Pt complains of head injury at work.  Cannot hear out of either ear.  Had some clear liquids and blood from both of his ears.  Review of Systems  Positive: Head injury Negative:   Physical Exam  BP 136/82 (BP Location: Left Arm)   Pulse 65   Temp 98.8 F (37.1 C) (Oral)   Resp 16   Ht 5' 4 (1.626 m)   Wt 67 kg   SpO2 99%   BMI 25.35 kg/m  Gen:   Awake, no distress   Resp:  Normal effort  MSK:   Moves extremities without difficulty  Other:    Medical Decision Making  Medically screening exam initiated at 5:51 PM.  Appropriate orders placed.  Dean Hale was informed that the remainder of the evaluation will be completed by another provider, this initial triage assessment does not replace that evaluation, and the importance of remaining in the ED until their evaluation is complete.  Head injury   Dean Hale A, PA-C 11/28/23 1752

## 2023-11-28 NOTE — ED Triage Notes (Addendum)
 Patient cannot hear at all out of right ear. Now left ear is causing him trouble. Pressure valve caused both of his ears to pop. Yellow came out of right ear. Ringing in both ears.

## 2023-11-28 NOTE — ED Provider Notes (Signed)
 [place] EMERGENCY DEPARTMENT AT [facility] Provider Note   CSN: [id number] Arrival date & time: 11/28/23  1705     Patient presents with: Tinnitus   Dean Hale is a 18 y.o. male.  With past medical history of learning disability presents emergency room with complaint of right ear injury.  Patient reports he was at work when he removed pressure valve that popped irregularly and then started experiencing bilateral increased hearing loss and ear pain.  He currently has hearing ringing in his right ear.  After this happened he experienced some fluid draining from his left.   HPI     Prior to Admission medications   Medication Sig Start Date End Date Taking? Authorizing Provider  albuterol  (PROVENTIL  HFA;VENTOLIN  HFA) 108 (90 Base) MCG/ACT inhaler Inhale 1-2 puffs into the lungs every 6 (six) hours as needed for wheezing or shortness of breath. 01/29/18   Reichert, Bernardino PARAS, MD  amoxicillin -clavulanate (AUGMENTIN ) 875-125 MG tablet Take 1 tablet by mouth every 12 (twelve) hours. 07/15/23   Remi Pippin, NP  cetirizine  (ZYRTEC ) 10 MG tablet Take 10 mg by mouth daily.    [provider]  chlorhexidine  (PERIDEX ) 0.12 % solution Use as directed 15 mLs in the mouth or throat 2 (two) times daily. 07/15/23   Remi Pippin, NP  cyproheptadine (PERIACTIN) 4 MG tablet Take 4 mg by mouth in the morning, at noon, and at bedtime. 09/10/20   [provider]  desvenlafaxine (PRISTIQ) 50 MG 24 hr tablet Take 50 mg by mouth daily. 08/15/20   [provider]  EPINEPHrine  0.3 mg/0.3 mL IJ SOAJ injection Inject 0.3 mg into the muscle as needed for anaphylaxis. 09/16/20   [provider]  lidocaine  (XYLOCAINE ) 2 % solution Use as directed 15 mLs in the mouth or throat as needed for mouth pain. 07/15/23   Remi Pippin, NP  montelukast  (SINGULAIR ) 10 MG tablet Take 1 tablet (10 mg total) by mouth at bedtime. 02/05/22   Ettie Gull, MD  Multiple Vitamin (MULTIVITAMIN) tablet  Take 1 tablet by mouth daily.    [provider]  ondansetron  (ZOFRAN -ODT) 4 MG disintegrating tablet Take 1 tablet (4 mg total) by mouth every 8 (eight) hours as needed. 08/08/23   Dean Clarity, MD  pantoprazole  (PROTONIX ) 40 MG tablet Take 1 tablet (40 mg total) by mouth 2 (two) times daily. 01/12/22 02/11/22  Ettie Gull, MD  SYMBICORT 160-4.5 MCG/ACT inhaler Inhale 1 puff into the lungs 2 (two) times daily.    [provider]    Allergies: Peanut-containing drug products, Almond (diagnostic), Cashew nut oil, Shellfish allergy, Fish allergy, and Insect extract    Review of Systems  HENT:  Positive for ear pain.     Updated Vital Signs BP 136/82 (BP Location: Left Arm)   Pulse 65   Temp 98.8 F (37.1 C) (Oral)   Resp 16   Ht 5' 4 (1.626 m)   Wt 67 kg   SpO2 99%   BMI 25.35 kg/m   Physical Exam Vitals and nursing note reviewed.  Constitutional:      General: He is not in acute distress.    Appearance: He is not toxic-appearing.  HENT:     Head: Normocephalic and atraumatic.     Right Ear: Tympanic membrane, ear canal and external ear normal. Decreased hearing noted. No drainage. Tympanic membrane is not perforated.     Left Ear: Tympanic membrane, ear canal and external ear normal. Decreased hearing noted. No drainage. Tympanic  membrane is not perforated.  Eyes:     General: No scleral icterus.    Conjunctiva/sclera: Conjunctivae normal.  Cardiovascular:     Rate and Rhythm: Normal rate and regular rhythm.     Pulses: Normal pulses.     Heart sounds: Normal heart sounds.  Pulmonary:     Effort: Pulmonary effort is normal. No respiratory distress.     Breath sounds: Normal breath sounds.  Abdominal:     General: Abdomen is flat. Bowel sounds are normal.     Palpations: Abdomen is soft.     Tenderness: There is no abdominal tenderness.  Musculoskeletal:     Right lower leg: No edema.     Left lower leg: No edema.  Skin:    General: Skin is warm  and dry.     Findings: No lesion.  Neurological:     General: No focal deficit present.     Mental Status: He is alert and oriented to person, place, and time. Mental status is at baseline.     (all labs ordered are listed, but only abnormal results are displayed) Labs Reviewed - No data to display  EKG: None  Radiology: CT Temporal Bones Wo Contrast Result Date: 11/28/2023 CLINICAL DATA:  Ataxia, head trauma EXAM: CT TEMPORAL BONES WITHOUT CONTRAST TECHNIQUE: Axial and coronal plane CT imaging of the petrous temporal bones was performed with thin-collimation image reconstruction. No intravenous contrast was administered. Multiplanar CT image reconstructions were also generated. RADIATION DOSE REDUCTION: This exam was performed according to the departmental dose-optimization program which includes automated exposure control, adjustment of the mA and/or kV according to patient size and/or use of iterative reconstruction technique. COMPARISON:  None Available. FINDINGS: RIGHT TEMPORAL BONE External auditory canal: Normal. Middle ear cavity: Normally aerated. The scutum and ossicles are normal. The tegmen tympani is intact. Inner ear structures: The cochlea, vestibule and semicircular canals are normal. The vestibular aqueduct is not enlarged. Internal auditory and facial nerve canals:  Normal Mastoid air cells: Normally aerated. No osseous erosion. LEFT TEMPORAL BONE External auditory canal: Normal. Middle ear cavity: Normally aerated. The scutum and ossicles are normal. The tegmen tympani is intact. Inner ear structures: The cochlea, vestibule and semicircular canals are normal. The vestibular aqueduct is not enlarged. Internal auditory and facial nerve canals:  Normal. Mastoid air cells: Normally aerated. No osseous erosion. Vascular: Normal non-contrast appearance of the carotid canals, jugular bulbs and sigmoid plates. Limited intracranial:  No acute or significant finding. Visible orbits/paranasal  sinuses: No acute or significant finding. Soft tissues: Normal. IMPRESSION: Normal CT of the temporal bones. Electronically Signed   By: Gilmore GORMAN Molt M.D.   On: 11/28/2023 20:08   CT Head Wo Contrast Result Date: 11/28/2023 CLINICAL DATA:  Head trauma, moderate-severe EXAM: CT HEAD WITHOUT CONTRAST TECHNIQUE: Contiguous axial images were obtained from the base of the skull through the vertex without intravenous contrast. RADIATION DOSE REDUCTION: This exam was performed according to the departmental dose-optimization program which includes automated exposure control, adjustment of the mA and/or kV according to patient size and/or use of iterative reconstruction technique. COMPARISON:  MRI head August 08, 2023.  CT head July 04, 2023. FINDINGS: Brain: No evidence of acute infarction, hemorrhage, hydrocephalus, extra-axial collection or mass lesion/mass effect. Vascular: No hyperdense vessel. Skull: No acute fracture. Sinuses/Orbits: Clear sinuses.  No acute orbital findings. Other: No mastoid effusions. IMPRESSION: No evidence of acute intracranial abnormality. Electronically Signed   By: Gilmore GORMAN Molt M.D.   On: 11/28/2023 20:03  Procedures   Medications Ordered in the ED - No data to display                                  Medical Decision Making  This patient presents to the ED for concern of ear problem, this involves an extensive number of treatment options, and is a complaint that carries with it a high risk of complications and morbidity.  The differential diagnosis includes TM perf, trauma, AOM, AOE    Imaging Studies ordered:  I ordered imaging studies including CT head, CT  temporal bones  I independently visualized and interpreted imaging which showed no acute findings.  I agree with the radiologist interpretation    Problem List / ED Course / Critical interventions / Medication management  Patient presents emergency room with complaints of malaise, drainage  from the ears and ear pain following an incident at work. Reports a very loud noise and then acute onset of symptoms. No TM perforation. No drainage from ears. Dose not decreased hearing. CT head and temporal bones without acute findings. Overall reassuring physical exam and imaging. Stable and well appearing. No trauma to head or neck. Will start ear drops and given ENT follow up.   I have reviewed the patients home medicines and have made adjustments as needed   Plan F/u w/ PCP in 2-3d to ensure resolution of sx.  Patient was given return precautions. Patient stable for discharge at this time.  Patient educated on sx/dx and verbalized understanding of plan. Return to ER w/ new or worsening sx.       Final diagnoses:  Tinnitus of both ears  Bilateral change in hearing    ED Discharge Orders          Ordered    ciprofloxacin -dexamethasone  (CIPRODEX ) OTIC suspension  2 times daily        11/28/23 2024               Arvetta Araque, Warren SAILOR, PA-C 11/28/23 2026    Ruthe Cornet, DO 11/28/23 2253

## 2023-11-28 NOTE — Discharge Instructions (Signed)
 Please follow-up with ENT for further evaluation of your symptoms. Please take the eardrops as prescribed.  Return to ER symptoms.

## 2023-12-18 NOTE — Unmapped (Addendum)
 [facility] Delivery Pharm[facility] Refill Coordination Note    This pharmacist was notified by a technician that this patient has reported they are experiencing the following side effects bruising and swelling on leg following injection .. I have reached out to the patient and discussed side effects and recommendations for pt to try pre-dosing with an antihistamine, try giving injections in belly rather than leg.  If doesn't get better to please discuss with provider for either switching to syringe or changing therapies all together.       Approximate time spent: 15-20 minutes    Robert Allison, PharmD, Clinical Specialty Pharmacist  [facility]      Specialty Medication(s) to be Shipped:   Inflammatory Disorders: Dupixent     Other medication(s) to be shipped: No additional medications requested for fill at this time     Robert Allison, DOB: 15-Dec-2005  Phone: [phone] (home) [phone] (work)      All above HIPAA information was verified with patient.     Was a Nurse, learning disability used for this call? No    Completed refill call assessment today to schedule patient's medication shipment from the [facility] and [facility] Delivery Pharmacy  [phone]).  All relevant notes have been reviewed.     Specialty medication(s) and dose(s) confirmed: Regimen is correct and unchanged.   Changes to medications: Robert Allison reports no changes at this time.  Changes to insurance: No  New side effects reported not previously addressed with a pharmacist or physician: Yes - Patient reports Sore throat. Patient would not like to speak to the pharmacist today. Their provider is not aware.  Questions for the pharmacist: Yes: routed    Confirmed patient received a Conservation officer, historic buildings and a Surveyor, mining with first shipment. The patient will receive a drug information handout for each medication shipped and additional FDA Medication Guides as required.       DISEASE/MEDICATION-SPECIFIC INFORMATION        For patients on injectable medications: Patient currently has 1 doses left.  Next injection is scheduled for 8/6.    SPECIALTY MEDICATION ADHERENCE     Medication Adherence    Patient reported X missed doses in the last month: 0  Specialty Medication: DUPIXENT  PEN 300 mg/2 mL  Patient is on additional specialty medications: No              Were doses missed due to medication being on hold? No    Dupixent  300/2 mg/ml: 1 doses of medicine on hand        REFERRAL TO PHARMACIST     Referral to the pharmacist: Not needed      [facility]     Shipping address confirmed in Epic.     Cost and Payment: Patient has a $0 copay, payment information is not required.    Delivery Scheduled: Yes, Expected medication delivery date: 12/25/23.     Medication will be delivered via UPS to the prescription address in Epic WAM.    Robert Allison   [facility]  Specialty Technician

## 2023-12-19 NOTE — Unmapped (Signed)
 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 Potentially will want to switch to syringe or change therapies all-together.    Follow-up Needed? No    Additional Follow-up Comments     Supporting Information Approximate Time Spent on Intervention 11-20 minutes    Clinical Evidence Used Professional judgement

## 2023-12-24 MED FILL — DUPIXENT 300 MG/2 ML SUBCUTANEOUS PEN INJECTOR: SUBCUTANEOUS | 28 days supply | Qty: 8 | Fill #5

## 2024-01-24 NOTE — Unmapped (Signed)
 Robert Allison has been contacted in regards to their refill of Dupixent . At this time, they have declined refill due to severe side effects to injection.  He is having swelling, redness and immobility after each injection. Refill assessment call date has been updated per the patient's request.    [facility] and [facility] Delivery Pharm[facility] - Clinical Pharmacist Intervention   Reason for Intervention Issue Observed Disease state management          Additional Comments Patient stated that he is unclear if the Dupixent  is helping is EoE symptoms. He is unable to eat reguarly and finds himself eating chips mostly.  After each Dupixent  injection, is leg swells, becomes red to the point of immobility.   Intervention Type of Intervention Education performed  Recommend medication hold                  Recommendation provided, if applicable      Medication Medication(s) Involved Dupixent    Outcome Expected Result/Outcome of Intervention Improved medication adherence  Enhanced patient understanding  Improved patient safety    Additional Comments Patient stated taking an antihistamine before injection does not help.  He has an appt on 9/16 to review symptoms with new specialist.  I advised him to hold this weeks dose and to connect back with us  after he discussed therpy and disease state progression next week    Follow-up Needed? Yes    Additional Follow-up Comments Follow up with patient after he sees his new prescriber on 9/16   Supporting Information Approximate Time Spent on Intervention 21-30 minutes    Clinical Evidence Used Professional judgement  Chart review or clinical coordination

## 2024-02-07 NOTE — Unmapped (Signed)
 [facility] Specialty and Home Delivery Pharmacy Refill Coordination Note    Specialty Medication(s) to be Shipped:   Inflammatory Disorders: Dupixent     Other medication(s) to be shipped: No additional medications requested for fill at this time    Specialty Medications not needed at this time: N/A     Robert Allison, DOB: 2006/01/29  Phone: [phone] (home) [phone] (work)      All above HIPAA information was verified with patient.     Was a Nurse, learning disability used for this call? No    Completed refill call assessment today to schedule patient's medication shipment from the [facility] and Home Delivery Pharmacy  [phone]).  All relevant notes have been reviewed.     Specialty medication(s) and dose(s) confirmed: Regimen is correct and unchanged.   Changes to medications: Robert Allison reports no changes at this time.  Changes to insurance: No  New side effects reported not previously addressed with a pharmacist or physician: None reported  Questions for the pharmacist: No    Confirmed patient received a Conservation officer, historic buildings and a Surveyor, mining with first shipment. The patient will receive a drug information handout for each medication shipped and additional FDA Medication Guides as required.       DISEASE/MEDICATION-SPECIFIC INFORMATION        For patients on injectable medications: Next injection is scheduled for 02/08/24.    SPECIALTY MEDICATION ADHERENCE     Medication Adherence    Patient reported X missed doses in the last month: 0  Specialty Medication: dupilumab  (DUPIXENT  PEN) 300 mg/2 mL pen injector  Patient is on additional specialty medications: No  Patient is on more than two specialty medications: No  Informant: patient              Were doses missed due to medication being on hold? No    Dupixent   300 mg/2ml: 1 doses of medicine on hand       REFERRAL TO PHARMACIST     Referral to the pharmacist: Not needed      [facility]     Shipping address confirmed in Epic.     Cost and Payment: Patient has a $0 copay, payment information is not required.    Delivery Scheduled: Yes, Expected medication delivery date: 02/12/24.     Medication will be delivered via UPS to the prescription address in Epic WAM.    Robert Allison, PharmD   [facility] Special[facility] and Home Delivery Pharm[facility]  Specialty Pharmacist    [facility] Special[facility] and Home Delivery Pharmacy - Clinical Pharmacist Intervention   Reason for Intervention Issue Observed Side effect management  Medication allergy or adverse drug reaction (ADR)  Medication adherence          Additional Comments Patient discussed previous issues with Dupixent  with new provider.   Intervention Type of Intervention Medication delivery scheduled                  Recommendation provided, if applicable      Medication Medication(s) Involved Dupixent    Outcome Expected Result/Outcome of Intervention Enhanced patient understanding  Improved medication adherence  Improved therapy effectiveness  Improved patient safety    Additional Comments Patient stated the prescriber would like him to continue the medication at this time.  He was advised by MD to administer in his stomach as I suggested last time.  Patient will administer in stomach tomorrow and next shipment has been scheduled.    Follow-up Needed? No    Additional Follow-up Comments  Supporting Information Approximate Time Spent on Intervention 0-10 minutes    Clinical Evidence Used Professional judgement  Chart review or clinical coordination

## 2024-02-11 MED FILL — DUPIXENT 300 MG/2 ML SUBCUTANEOUS PEN INJECTOR: SUBCUTANEOUS | 28 days supply | Qty: 8 | Fill #6

## 2024-02-13 ENCOUNTER — Encounter (INDEPENDENT_AMBULATORY_CARE_PROVIDER_SITE_OTHER): Payer: Self-pay

## 2024-02-14 ENCOUNTER — Encounter (INDEPENDENT_AMBULATORY_CARE_PROVIDER_SITE_OTHER): Payer: Self-pay

## 2024-02-15 ENCOUNTER — Ambulatory Visit: Admitting: Podiatry

## 2024-02-15 DIAGNOSIS — Z91199 Patient's noncompliance with other medical treatment and regimen due to unspecified reason: Secondary | ICD-10-CM

## 2024-02-15 NOTE — Progress Notes (Signed)
Patient did not show for scheduled appointment today.

## 2024-03-03 NOTE — Unmapped (Signed)
 [facility] Specialty and Home Delivery Pharmacy Refill Coordination Note    Specialty Medication(s) to be Shipped:   Inflammatory Disorders: Dupixent     Other medication(s) to be shipped: No additional medications requested for fill at this time    Specialty Medications not needed at this time: N/A     Robert Allison, DOB: [birth year]  Phone: [phone] (work)      All above HIPAA information was verified with patient's family member, father.     Was a Nurse, learning disability used for this call? No    Completed refill call assessment today to schedule patient's medication shipment from the [facility] and Home Delivery Pharmacy  [phone]).  All relevant notes have been reviewed.     Specialty medication(s) and dose(s) confirmed: Regimen is correct and unchanged.   Changes to medications: Nochum reports no changes at this time.  Changes to insurance: No  New side effects reported not previously addressed with a pharmacist or physician: None reported  Questions for the pharmacist: No    Confirmed patient received a Conservation officer, historic buildings and a Surveyor, mining with first shipment. The patient will receive a drug information handout for each medication shipped and additional FDA Medication Guides as required.       DISEASE/MEDICATION-SPECIFIC INFORMATION        For patients on injectable medications: Next injection is scheduled for 10/24.    SPECIALTY MEDICATION ADHERENCE     Medication Adherence    Patient reported X missed doses in the last month: 0  Specialty Medication: DUPIXENT  PEN 300 mg/2 mL pen injector (dupilumab )  Patient is on additional specialty medications: No  Patient is on more than two specialty medications: No  Any gaps in refill history greater than 2 weeks in the last 3 months: no  Demonstrates understanding of importance of adherence: yes  Informant: father  Reliability of informant: reliable  Provider-estimated medication adherence level: good  Patient is at risk for Non-Adherence: No  Reasons for non-adherence: no problems identified  Confirmed plan for next specialty medication refill: delivery by pharmacy  Refills needed for supportive medications: not needed          Refill Coordination    Has the Patients' Contact Information Changed: No  Is the Shipping Address Different: No         Were doses missed due to medication being on hold? No    Dupixent   300/2 mg/ml:0 doses of medicine on hand       REFERRAL TO PHARMACIST     Referral to the pharmacist: Not needed      [facility]     Shipping address confirmed in Epic.     Cost and Payment: Patient has a $0 copay, payment information is not required.    Delivery Scheduled: Yes, Expected medication delivery date: 10/23.     Medication will be delivered via UPS to the prescription address in Epic WAM.    [name]  Specialty Technician

## 2024-03-05 MED FILL — DUPIXENT 300 MG/2 ML SUBCUTANEOUS PEN INJECTOR: SUBCUTANEOUS | 28 days supply | Qty: 8 | Fill #7

## 2024-04-01 DIAGNOSIS — K200 Eosinophilic esophagitis: Principal | ICD-10-CM

## 2024-04-01 NOTE — Progress Notes (Signed)
 [facility] Specialty and Home Delivery Pharmacy Refill Coordination Note    Specialty Medication(s) to be Shipped:   Inflammatory Disorders: Dupixent     Other medication(s) to be shipped: No additional medications requested for fill at this time    Specialty Medications not needed at this time: N/A     Robert Allison, DOB: 02/29/2006  Phone: [phone] (work)      All above HIPAA information was verified with patient's family member, father.     Was a nurse, learning disability used for this call? No    Completed refill call assessment today to schedule patient's medication shipment from the [facility] and Home Delivery Pharmacy  [phone]).  All relevant notes have been reviewed.     Specialty medication(s) and dose(s) confirmed: Regimen is correct and unchanged.   Changes to medications: Robert Allison reports stopping the following medications: protonix  started another medication to replace   Changes to insurance: No  New side effects reported not previously addressed with a pharmacist or physician: None reported  Questions for the pharmacist: No    Confirmed patient received a Conservation Officer, Historic Buildings and a Surveyor, Mining with first shipment. The patient will receive a drug information handout for each medication shipped and additional FDA Medication Guides as required.       DISEASE/MEDICATION-SPECIFIC INFORMATION        For patients on injectable medications: Next injection is scheduled for 11/20,11/27.    SPECIALTY MEDICATION ADHERENCE     Medication Adherence    Patient reported X missed doses in the last month: 0  Specialty Medication: dupilumab : DUPIXENT  PEN 300 mg/2 mL pen injector  Patient is on additional specialty medications: No  Patient is on more than two specialty medications: No  Any gaps in refill history greater than 2 weeks in the last 3 months: no  Demonstrates understanding of importance of adherence: yes  Informant: father  Reliability of informant: reliable  Provider-estimated medication adherence level: good  Patient is at risk for Non-Adherence: No  Reasons for non-adherence: no problems identified  Confirmed plan for next specialty medication refill: delivery by pharmacy  Refills needed for supportive medications: not needed          Refill Coordination    Has the Patients' Contact Information Changed: No  Is the Shipping Address Different: No         Were doses missed due to medication being on hold? No    Dupixent   300/2 mg/ml: 14 days of medicine on hand       REFERRAL TO PHARMACIST     Referral to the pharmacist: Not needed      [facility]     Shipping address confirmed in Epic.     Cost and Payment: Patient has a $0 copay, payment information is not required.    Delivery Scheduled: Yes, Expected medication delivery date: 11/25.     Medication will be delivered via UPS to the prescription address in Epic WAM.    Robert Allison [facility]  Specialty Technician

## 2024-04-07 NOTE — Progress Notes (Signed)
 Robert Allison 's Dupixent  shipment will be delayed as a result of prior authorization being required by the patient's insurance.     I have reached out to the patient  at [phone] and communicated the delay. We will call the patient back to reschedule the delivery upon resolution. We have not confirmed the new delivery date.

## 2024-04-21 NOTE — Progress Notes (Signed)
 Robert Allison 's DUPIXENT  PEN 300 mg/2 mL pen injector (dupilumab ) shipment will be canceled as a result of prior authorization now approved.     I have reached out to the patient  at [phone] and left a voicemail message.  We will not reschedule the medication and have removed this/these medication(s) from the work request.  We have canceled this work request.

## 2024-05-09 ENCOUNTER — Emergency Department (HOSPITAL_COMMUNITY)
Admission: EM | Admit: 2024-05-09 | Discharge: 2024-05-09 | Disposition: A | Discharge status: Home / Self Care | Attending: Emergency Medicine | Admitting: Emergency Medicine

## 2024-05-09 ENCOUNTER — Emergency Department (HOSPITAL_COMMUNITY)

## 2024-05-09 ENCOUNTER — Encounter (HOSPITAL_COMMUNITY): Payer: Self-pay | Admitting: Emergency Medicine

## 2024-05-09 ENCOUNTER — Other Ambulatory Visit: Payer: Self-pay

## 2024-05-09 DIAGNOSIS — J069 Acute upper respiratory infection, unspecified: Secondary | ICD-10-CM | POA: Insufficient documentation

## 2024-05-09 DIAGNOSIS — R059 Cough, unspecified: Secondary | ICD-10-CM | POA: Diagnosis present

## 2024-05-09 DIAGNOSIS — Z91010 Allergy to peanuts: Secondary | ICD-10-CM | POA: Diagnosis not present

## 2024-05-09 HISTORY — DX: Essential (primary) hypertension: I10

## 2024-05-09 LAB — COMPREHENSIVE METABOLIC PANEL WITH GFR
ALT: 30 U/L (ref 0–44)
AST: 32 U/L (ref 15–41)
Albumin: 4.6 g/dL (ref 3.5–5.0)
Alkaline Phosphatase: 58 U/L (ref 38–126)
Anion gap: 13 (ref 5–15)
BUN: 12 mg/dL (ref 6–20)
CO2: 23 mmol/L (ref 22–32)
Calcium: 9.4 mg/dL (ref 8.9–10.3)
Chloride: 100 mmol/L (ref 98–111)
Creatinine, Ser: 0.97 mg/dL (ref 0.61–1.24)
GFR, Estimated: >60 mL/min
Glucose, Bld: 105 mg/dL — ABNORMAL HIGH (ref 70–99)
Potassium: 3.9 mmol/L (ref 3.5–5.1)
Sodium: 136 mmol/L (ref 135–145)
Total Bilirubin: 0.5 mg/dL (ref 0.0–1.2)
Total Protein: 7.2 g/dL (ref 6.5–8.1)

## 2024-05-09 LAB — RESP PANEL BY RT-PCR (RSV, FLU A&B, COVID)  RVPGX2
Influenza A by PCR: NEGATIVE
Influenza B by PCR: POSITIVE — AB
Resp Syncytial Virus by PCR: NEGATIVE
SARS Coronavirus 2 by RT PCR: NEGATIVE

## 2024-05-09 LAB — CBC WITH DIFFERENTIAL/PLATELET
Abs Immature Granulocytes: 0.01 K/uL (ref 0.00–0.07)
Basophils Absolute: 0 K/uL (ref 0.0–0.1)
Basophils Relative: 1 %
Eosinophils Absolute: 0 K/uL (ref 0.0–0.5)
Eosinophils Relative: 0 %
HCT: 46 % (ref 39.0–52.0)
Hemoglobin: 15.6 g/dL (ref 13.0–17.0)
Immature Granulocytes: 0 %
Lymphocytes Relative: 18 %
Lymphs Abs: 0.8 K/uL (ref 0.7–4.0)
MCH: 29.6 pg (ref 26.0–34.0)
MCHC: 33.9 g/dL (ref 30.0–36.0)
MCV: 87.3 fL (ref 80.0–100.0)
Monocytes Absolute: 0.8 K/uL (ref 0.1–1.0)
Monocytes Relative: 18 %
Neutro Abs: 2.7 K/uL (ref 1.7–7.7)
Neutrophils Relative %: 63 %
Platelets: 188 K/uL (ref 150–400)
RBC: 5.27 MIL/uL (ref 4.22–5.81)
RDW: 12.3 % (ref 11.5–15.5)
WBC: 4.3 K/uL (ref 4.0–10.5)
nRBC: 0 % (ref 0.0–0.2)

## 2024-05-09 LAB — D-DIMER, QUANTITATIVE: D-Dimer, Quant: <0.27 ug{FEU}/mL (ref 0.00–0.50)

## 2024-05-09 MED ORDER — DOXYCYCLINE HYCLATE 100 MG PO CAPS: 100.0000 mg | ORAL_CAPSULE | Freq: Two times a day (BID) | ORAL | 0 refills | Status: AC

## 2024-05-09 MED ORDER — IBUPROFEN 400 MG PO TABS
600.0000 mg | ORAL_TABLET | Freq: Once | ORAL | Status: AC
  Administered 2024-05-09: 600 mg via ORAL
  Filled 2024-05-09: qty 1

## 2024-05-09 NOTE — ED Provider Notes (Addendum)
 " [place] AT [facility] Provider Note   CSN: [id number] Arrival date & time: 05/09/24  9488     Patient presents with: Body Aches , Cough   Dean Hale is a 18 y.o. male who presents with 3 days body aches, congestion, fever, nonproductive cough, and fatigue.  No known ill contacts, only Tylenol  at home.   HPI     Prior to Admission medications  Medication Sig Start Date End Date Taking? Authorizing Provider  albuterol  (PROVENTIL  HFA;VENTOLIN  HFA) 108 (90 Base) MCG/ACT inhaler Inhale 1-2 puffs into the lungs every 6 (six) hours as needed for wheezing or shortness of breath. 01/29/18   Reichert, Bernardino PARAS, MD  amoxicillin -clavulanate (AUGMENTIN ) 875-125 MG tablet Take 1 tablet by mouth every 12 (twelve) hours. 07/15/23   Remi Pippin, NP  cetirizine  (ZYRTEC ) 10 MG tablet Take 10 mg by mouth daily.    [provider]  chlorhexidine  (PERIDEX ) 0.12 % solution Use as directed 15 mLs in the mouth or throat 2 (two) times daily. 07/15/23   Remi Pippin, NP  ciprofloxacin -dexamethasone  (CIPRODEX ) OTIC suspension Place 4 drops into both ears 2 (two) times daily. 11/28/23   Barrett, Warren SAILOR, PA-C  cyproheptadine (PERIACTIN) 4 MG tablet Take 4 mg by mouth in the morning, at noon, and at bedtime. 09/10/20   [provider]  desvenlafaxine (PRISTIQ) 50 MG 24 hr tablet Take 50 mg by mouth daily. 08/15/20   [provider]  EPINEPHrine  0.3 mg/0.3 mL IJ SOAJ injection Inject 0.3 mg into the muscle as needed for anaphylaxis. 09/16/20   [provider]  lidocaine  (XYLOCAINE ) 2 % solution Use as directed 15 mLs in the mouth or throat as needed for mouth pain. 07/15/23   Remi Pippin, NP  montelukast  (SINGULAIR ) 10 MG tablet Take 1 tablet (10 mg total) by mouth at bedtime. 02/05/22   Ettie Gull, MD  Multiple Vitamin (MULTIVITAMIN) tablet Take 1 tablet by mouth daily.    [provider]  ondansetron  (ZOFRAN -ODT) 4 MG disintegrating tablet  Take 1 tablet (4 mg total) by mouth every 8 (eight) hours as needed. 08/08/23   Dean Clarity, MD  pantoprazole  (PROTONIX ) 40 MG tablet Take 1 tablet (40 mg total) by mouth 2 (two) times daily. 01/12/22 02/11/22  Ettie Gull, MD  SYMBICORT 160-4.5 MCG/ACT inhaler Inhale 1 puff into the lungs 2 (two) times daily.    [provider]    Allergies: Peanut-containing drug products, Almond (diagnostic), Cashew nut oil, Shellfish allergy, Fish allergy, and Insect extract    Review of Systems  Constitutional:  Positive for appetite change, chills and fever.  HENT:  Positive for congestion.   Eyes: Negative.   Respiratory:  Positive for cough.   Gastrointestinal:  Positive for nausea and vomiting.  Genitourinary: Negative.   Neurological: Negative.     Updated Vital Signs BP (!) 145/100 (BP Location: Right Arm)   Pulse (!) 104   Temp 99.6 F (37.6 C)   Resp 18   SpO2 96%   Physical Exam Vitals and nursing note reviewed.  Constitutional:      Appearance: He is not ill-appearing or toxic-appearing.  HENT:     Head: Normocephalic and atraumatic.     Nose: Nose normal.     Mouth/Throat:     Mouth: Mucous membranes are moist.     Pharynx: Oropharynx is clear. Uvula midline. No oropharyngeal exudate or posterior oropharyngeal erythema.  Eyes:     General:  Right eye: No discharge.        Left eye: No discharge.     Conjunctiva/sclera: Conjunctivae normal.  Neck:     Trachea: Trachea and phonation normal.  Cardiovascular:     Rate and Rhythm: Normal rate and regular rhythm.     Pulses: Normal pulses.     Heart sounds: Normal heart sounds. No murmur heard. Pulmonary:     Effort: Pulmonary effort is normal. No respiratory distress.     Breath sounds: Normal breath sounds. No wheezing or rales.  Abdominal:     General: Bowel sounds are normal. There is no distension.     Palpations: Abdomen is soft.     Tenderness: There is no abdominal tenderness.  Musculoskeletal:         General: No deformity.     Cervical back: Neck supple.     Right lower leg: No edema.     Left lower leg: No edema.  Lymphadenopathy:     Cervical: Cervical adenopathy present.     Right cervical: Superficial cervical adenopathy present.     Left cervical: Superficial cervical adenopathy present.  Skin:    General: Skin is warm and dry.     Capillary Refill: Capillary refill takes less than 2 seconds.  Neurological:     General: No focal deficit present.     Mental Status: He is alert and oriented to person, place, and time. Mental status is at baseline.  Psychiatric:        Mood and Affect: Mood normal.     (all labs ordered are listed, but only abnormal results are displayed) Labs Reviewed  RESP PANEL BY RT-PCR (RSV, FLU A&B, COVID)  RVPGX2 - Abnormal; Notable for the following components:      Result Value   Influenza B by PCR POSITIVE (*)    All other components within normal limits    EKG: None  Radiology: No results found.   Procedures   Medications Ordered in the ED  ibuprofen  (ADVIL ) tablet 600 mg (600 mg Oral Given 05/09/24 9372)                                    Medical Decision Making 18 year old male flulike symptoms  Hypertensive very mild tachycardia on arrival resolved at time my evaluation.  Cardiopulmonary abdominal exams are benign.  Patient with congestion, physical, otherwise reassuring.  Amount and/or Complexity of Data Reviewed Labs:     Details: Flu B positive. Radiology: ordered.   Clinical picture most consistent with acute influenza infection is diagnosed today and flu testing.  Clinical concern for emergent underlying condition that would warrant further ED workup or inpatient management is exceedingly low.  Patient to continue over-the-counter supportive care.  Maor voiced understanding of her medical evaluation and treatment plan. Each of their questions answered to their expressed satisfaction.  Care of this patient signed  out to oncoming ED provider at time of shift change. All pertinent HPI, physical exam, and laboratory findings were discussed with them prior to my departure. Disposition of patient pending completion of workup, reevaluation, and clinical judgement of oncoming ED provider.  Pending CXR, anticipate d/c.  This chart was dictated using voice recognition software, Dragon. Despite the best efforts of this provider to proofread and correct errors, errors may still occur which can change documentation meaning.     Final diagnoses:  None    ED Discharge Orders  None          Bobette Pleasant SAUNDERS, PA-C 05/09/24 9367    Bobette Pleasant SAUNDERS, PA-C 05/09/24 9366    Bari Charmaine FALCON, MD 05/09/24 [phone]  "

## 2024-05-09 NOTE — Discharge Instructions (Addendum)
 Thank you for visiting the Emergency Department today. It was a pleasure to be part of your healthcare team.   Your were seen today for flulike symptoms, and your test results showed positive for influenza B and your imaging also showed signs of pneumonia.   As discussed, please utilize Tylenol  and Ibuprofen  as needed for fever and pain relief. You have been prescribed doxycycline , and you should take your medications as directed. If you have any questions about your medicines, please call your pharmacy or healthcare provider. At home, rest, hydrate, and resume normal diet as tolerated.   It is important to watch for warning signs such as worsening pain, fever, trouble breathing, or chest pain. If any of these happen, return to the Emergency Department or call 911.  Thank you for trusting us  with your health.

## 2024-05-09 NOTE — ED Triage Notes (Signed)
 Patient reports body aches for 3 days with cough and chest congestion .

## 2024-05-09 NOTE — ED Provider Notes (Signed)
 Handoff -- Flu B +  Pending chest xray due to reported hemoptysis - can d/c after if unremarkable  Physical Exam  BP (!) 145/100 (BP Location: Right Arm)   Pulse (!) 104   Temp 99.6 F (37.6 C)   Resp 18   SpO2 96%   Physical Exam  Procedures  Procedures  ED Course / MDM   Medical Decision Making Amount and/or Complexity of Data Reviewed Labs: ordered. Decision-making details documented in ED Course. Radiology: ordered. Decision-making details documented in ED Course.  Risk Prescription drug management.   Patient presents to the ED for: Influenza B  Clinical Course as of 05/09/24 0855  Fri May 09, 2024  0656 Temp: 99.6 F (37.6 C) Afebrile, slightly tachycardic - vital stable, patient in no acute distress [ML]  0656 Resp panel by RT-PCR (RSV, Flu A&B, Covid) Anterior Nasal Swab(!) Influenza B + [ML]  0656 Ibuprofen  for symptomatic relief-well-tolerated [ML]  0731 DG Chest 2 View Possible focal pneumonia --  [ML]  0824 CBC with Differential WNL [ML]  0845 Comprehensive metabolic panel(!) No acute findings [ML]  0846 D-dimer, quantitative Negative -  [ML]  0855 Pulse Rate: 78 Non-tachycardic reassessment [ML]    Clinical Course User Index [ML] Willma Duwaine CROME, PA    Data Reviewed / Actions Taken: Labs ordered/reviewed with my independent interpretation in ED course above. Imaging ordered/reviewed with my independent interpretation in ED course above. I agree with the radiologists interpretation.   ED Course / Reassessments: Problem List: Influenza B 18 year old male presented for flulike symptoms. Initial assessment included history, physical exam, and review of prior medical records. Laboratory testing was obtained given clinical presentation and viral PCR testing returned positive for influenza B.  There is clinical suspicion for bacterial etiology given chest xray findings of possible focal pneumonia - will prescribe antibiotic treatment regimen. Overall,  patient had reassuring vital signs, laboratory studies, and clinical appearance. Patients hemoptysis unlikely from underlying PE given ddimer. The patient was treated symptomatically with antipyretics and supportive care.  Antiviral therapy was discussed, including risks and benefit and was deferred based on symptom duration and shared decision making.  The patient remained stable during the ED course and was deemed appropriate for outpatient management.  Discharge planning include strict return precautions for worsening respiratory symptoms, persistent high fevers, chest pain, inability to tolerate oral intake, or new neurological symptoms.  The patient was advised on continued supportive care at home, including hydration, rest, and antipyretic use, as well as isolation precautions to limit transmission.   Disposition: Disposition: Discharge with close follow-up with PCP for further evaluation and care Rationale for disposition: Stable for discharge The disposition plan and rationale were discussed with the patient at the bedside, all questions were addressed, and the patient demonstrated understanding.  This note was produced using Electronics Engineer. While I have reviewed and verified all clinical information, transcription errors may remain.        Willma Duwaine CROME, [place] 05/09/24 0900    Neysa Caron PARAS, DO 05/09/24 1652

## 2024-05-22 MED ORDER — DUPILUMAB 300 MG/2 ML SUBCUTANEOUS PEN INJECTOR
SUBCUTANEOUS | 11 refills | 28.00000 days
Start: 2024-05-22 — End: 2024-08-20

## 2024-05-22 MED ORDER — DUPIXENT 300 MG/2 ML SUBCUTANEOUS PEN INJECTOR
SUBCUTANEOUS | 11 refills | 0.00000 days
Start: 2024-05-22 — End: ?

## 2024-05-29 NOTE — Progress Notes (Signed)
 [facility] Refill Coordination Note    Specialty Medication(s) to be Shipped:   Inflammatory Disorders: Dupixent     Other medication(s) to be shipped: No additional medications requested for fill at this time    Specialty Medications not needed at this time: N/A     Robert Allison, DOB: [birth year]  Phone: [phone] (work)      All above HIPAA information was verified with patient.     Was a nurse, learning disability used for this call? No    Completed refill call assessment today to schedule patient's medication shipment from the [facility]  [phone]).  All relevant notes have been reviewed.     Specialty medication(s) and dose(s) confirmed: Regimen is correct and unchanged.   Changes to medications: Robert Allison reports starting the following medications: Sucralfate,Rabeprozole   Changes to insurance: No  New side effects reported not previously addressed with a pharmacist or physician: None reported  Questions for the pharmacist: No    Confirmed patient received a Conservation Officer, Historic Buildings and a Surveyor, Mining with first shipment. The patient will receive a drug information handout for each medication shipped and additional FDA Medication Guides as required.       DISEASE/MEDICATION-SPECIFIC INFORMATION        N/A    SPECIALTY MEDICATION ADHERENCE     Medication Adherence    Patient reported X missed doses in the last month: 0  Specialty Medication: dupilumab : DUPIXENT  PEN 300 mg/2 mL pen injector  Patient is on additional specialty medications: No  Patient is on more than two specialty medications: No  Any gaps in refill history greater than 2 weeks in the last 3 months: no  Demonstrates understanding of importance of adherence: yes  Informant: patient  Reliability of informant: reliable  Provider-estimated medication adherence level: good  Patient is at risk for Non-Adherence: No  Reasons for non-adherence: no problems identified  Confirmed plan for next specialty medication refill: delivery by pharmacy  Refills needed for supportive medications: not needed          Refill Coordination    Has the Patients' Contact Information Changed: No  Is the Shipping Address Different: No         Were doses missed due to medication being on hold? No    Dupixent   300/2 mg/ml:  2 doses of medicine on hand         Specialty medication is an injection or given on a cycle: Yes, Next injection is scheduled for 01/15,01/22.    REFERRAL TO PHARMACIST     Referral to the pharmacist: Not needed      [facility]     Shipping address confirmed in Epic.     Cost and Payment: Patient has a $0 copay, payment information is not required.    Delivery Scheduled: Yes, Expected medication delivery date: 06/05/24.     Medication will be delivered via UPS to the prescription address in Epic WAM.    [facility]  Specialty Technician

## 2024-06-04 MED FILL — DUPIXENT 300 MG/2 ML SUBCUTANEOUS PEN INJECTOR: SUBCUTANEOUS | 28 days supply | Qty: 8 | Fill #0
